# Patient Record
Sex: Female | Born: 1950 | Race: White | Hispanic: No | Marital: Married | Smoking: Former smoker
Health system: Southern US, Community
[De-identification: ages and names within clinical notes are randomized; demographics above are authoritative.]

## PROBLEM LIST (undated history)

## (undated) ENCOUNTER — Emergency Department (HOSPITAL_COMMUNITY)

## (undated) DIAGNOSIS — K589 Irritable bowel syndrome without diarrhea: Secondary | ICD-10-CM

## (undated) DIAGNOSIS — F419 Anxiety disorder, unspecified: Secondary | ICD-10-CM

## (undated) DIAGNOSIS — I471 Supraventricular tachycardia, unspecified: Secondary | ICD-10-CM

## (undated) DIAGNOSIS — L309 Dermatitis, unspecified: Secondary | ICD-10-CM

## (undated) DIAGNOSIS — Z9889 Other specified postprocedural states: Secondary | ICD-10-CM

## (undated) DIAGNOSIS — R002 Palpitations: Secondary | ICD-10-CM

## (undated) DIAGNOSIS — R112 Nausea with vomiting, unspecified: Secondary | ICD-10-CM

## (undated) DIAGNOSIS — K219 Gastro-esophageal reflux disease without esophagitis: Secondary | ICD-10-CM

## (undated) DIAGNOSIS — R519 Headache, unspecified: Secondary | ICD-10-CM

## (undated) DIAGNOSIS — I341 Nonrheumatic mitral (valve) prolapse: Secondary | ICD-10-CM

## (undated) HISTORY — PX: OTHER SURGICAL HISTORY: SHX169

## (undated) HISTORY — PX: TOOTH EXTRACTION: SUR596

## (undated) HISTORY — PX: TUBAL LIGATION: SHX77

## (undated) HISTORY — DX: Gastro-esophageal reflux disease without esophagitis: K21.9

## (undated) HISTORY — PX: COLONOSCOPY: SHX174

## (undated) HISTORY — PX: CHOLECYSTECTOMY: SHX55

## (undated) HISTORY — DX: Dermatitis, unspecified: L30.9

## (undated) HISTORY — PX: BREAST CYST ASPIRATION: SHX578

---

## 1998-03-17 ENCOUNTER — Ambulatory Visit (HOSPITAL_COMMUNITY): Admission: RE | Admit: 1998-03-17 | Discharge: 1998-03-17 | Payer: Self-pay | Admitting: Family Medicine

## 2001-12-06 ENCOUNTER — Ambulatory Visit (HOSPITAL_BASED_OUTPATIENT_CLINIC_OR_DEPARTMENT_OTHER): Admission: RE | Admit: 2001-12-06 | Discharge: 2001-12-06 | Payer: Self-pay | Admitting: Orthopedic Surgery

## 2002-10-28 ENCOUNTER — Encounter: Payer: Self-pay | Admitting: Emergency Medicine

## 2002-10-28 ENCOUNTER — Emergency Department (HOSPITAL_COMMUNITY): Admission: EM | Admit: 2002-10-28 | Discharge: 2002-10-29 | Payer: Self-pay | Admitting: Emergency Medicine

## 2002-11-02 ENCOUNTER — Emergency Department (HOSPITAL_COMMUNITY): Admission: EM | Admit: 2002-11-02 | Discharge: 2002-11-02 | Payer: Self-pay | Admitting: Emergency Medicine

## 2002-11-07 ENCOUNTER — Ambulatory Visit (HOSPITAL_COMMUNITY): Admission: RE | Admit: 2002-11-07 | Discharge: 2002-11-07 | Payer: Self-pay | Admitting: Gastroenterology

## 2002-11-07 ENCOUNTER — Encounter (INDEPENDENT_AMBULATORY_CARE_PROVIDER_SITE_OTHER): Payer: Self-pay | Admitting: Specialist

## 2004-10-10 ENCOUNTER — Other Ambulatory Visit: Admission: RE | Admit: 2004-10-10 | Discharge: 2004-10-10 | Payer: Self-pay | Admitting: Family Medicine

## 2005-06-28 ENCOUNTER — Encounter: Payer: Self-pay | Admitting: Family Medicine

## 2005-11-03 ENCOUNTER — Other Ambulatory Visit: Admission: RE | Admit: 2005-11-03 | Discharge: 2005-11-03 | Payer: Self-pay | Admitting: Family Medicine

## 2007-11-14 ENCOUNTER — Other Ambulatory Visit: Admission: RE | Admit: 2007-11-14 | Discharge: 2007-11-14 | Payer: Self-pay | Admitting: Family Medicine

## 2009-06-24 ENCOUNTER — Ambulatory Visit (HOSPITAL_BASED_OUTPATIENT_CLINIC_OR_DEPARTMENT_OTHER): Admission: RE | Admit: 2009-06-24 | Discharge: 2009-06-24 | Payer: Self-pay | Admitting: Orthopedic Surgery

## 2010-02-27 DIAGNOSIS — G459 Transient cerebral ischemic attack, unspecified: Secondary | ICD-10-CM

## 2010-02-27 HISTORY — DX: Transient cerebral ischemic attack, unspecified: G45.9

## 2010-05-17 LAB — POCT HEMOGLOBIN-HEMACUE: Hemoglobin: 12 g/dL (ref 12.0–15.0)

## 2010-07-15 NOTE — Op Note (Signed)
   NAME:  Pamela Lang, Pamela Lang                            ACCOUNT NO.:  [id number]   MEDICAL RECORD NO.:  [id number]                   PATIENT TYPE:  AMB   LOCATION:  ENDO                                 FACILITY:  [facility]   PHYSICIAN:  John C. Madilyn Fireman, M.D.                 DATE OF BIRTH:  [birth year]   DATE OF PROCEDURE:  11/07/2002  DATE OF DISCHARGE:                                 OPERATIVE REPORT   INDICATIONS FOR PROCEDURE:  Colon cancer screening in a 60 year old patient  with no prior screening.   DESCRIPTION OF PROCEDURE:  The patient was placed in the left lateral  decubitus position and placed on the pulse monitor with continuous low flow  oxygen delivered via nasal cannula.  She was sedated with 100 mcg of IV  fentanyl and 10 mg of IV Versed.  The Olympus video colonoscope was inserted  into the rectum and advanced to the cecum, confirmed by the  transillumination of McBurney's point and visualization of the ileocecal  valve and the appendiceal orifice.  The prep was excellent.  In the base of  the cecum, there was a 6 mm sessile polyp removed via hot biopsy.  The  remainder of the cecum, ascending, transverse, descending and sigmoid colon  appeared normal with no further polyps, masses, diverticula or other mucosal  abnormalities.  In the rectum, there was a 1.2 cm pedunculated polyp removed  by snare and it was sent in the same container with the other polyp.  No  other abnormalities in the rectum were noted.  The scope was then withdrawn  and the patient returned to the recovery room in stable condition.  She  tolerated the procedure well and there were no immediate postoperative  complications.   IMPRESSION:  Cecal and rectal polyps.   PLAN:  Await histology to determine method and interval for future colon  screening.                                                John C. Madilyn Fireman, M.D.    JCH/MEDQ  D:  11/07/2002  T:  11/08/2002  Job:  [id number]   cc:   Christella Noa, M.D.  [address] [place]  [place], [place] [address]  Fax: [phone]

## 2010-07-15 NOTE — Op Note (Signed)
NAME:  Pamela Lang, Pamela Lang                            ACCOUNT NO.:  [id number]   MEDICAL RECORD NO.:  [id number]                   PATIENT TYPE:  AMB   LOCATION:  DSC                                  FACILITY:  [facility]   PHYSICIAN:  Loreta Ave, M.D.              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   OPERATIVE PROCEDURE:  Left elbow examination under anesthesia, arthroscopy,  synovectomy, chondroplasty, removal of loose bodies, and extensive removal  of posterior spurs causing posterior impingement.   SURGEON:  Loreta Ave, M.D.   ASSISTANT:  Arlys John D. Petrarca, P.A.-C.   ANESTHESIA:  General.   BLOOD LOSS:  Minimal.   TOURNIQUET TIME:  One hour.   SPECIMENS:  None.   CULTURES:  None.   COMPLICATIONS:  None.   DRESSINGS:  Soft compressive.   PROCEDURE:  The patient was brought to the operating room, and after  adequate anesthesia had been obtained, the elbow was examined.  Motion from  30 to 120 degrees.  Relatively abrupt end points.  Full rotation, pronation,  and supination.  Stable ligaments.  Tourniquet applied.  Prepped and draped  in the usual sterile fashion in a supine position with the fishing pole  attachment for distraction.  Four arthroscopic portals utilized, one in each  anterolateral, anteromedial, one posterolateral, and one straight  posteriorly.  Utilizing anterior portals, the front of the elbow was entered  with ____, distended, and inspected.  Abundant synovitis throughout  debrided.  Grade 3 changes, radiocapitellar joint debrided.  Grade 4 changes  over most of the trochlear ulnar joint.  Loose fragments removed.  Spurs on  the humerus blocking flexion debrided, allowing me to achieve just about  full flexion.  All loose fragments removed.  The area of the posterior  aspect of the elbow with the posterior portals.  Extensive spurring and  posterior bony impingement.  Olecranon and humeral spurs all removed.  The  humeral fossa was opened up generously with a shaver and bur.  All spurs on  the back of the olecranon also removed with a bur to improve motion as much  as possible. At completion,  I could bend it within ten degrees of full  extension, and there was no bony impingement.  The entirety of the elbow  examined arthroscopically to be sure all loose fragments and loose bodies  had been removed.  Instruments fully removed.  Portals injected with  Marcaine without epinephrine and closed with nylon.  A sterile compressive  dressing was applied.  The tourniquet which had been inflated at the start  of the  case after exsanguination and 250 mmHg was deflated.  Anesthesia  reversed.  Brought to the recovery room.  Tolerated surgery well without  complications.                                               Loreta Ave, M.D.    DFM/MEDQ  D:  12/06/2001  T:  12/07/2001  Job:  [id number]

## 2011-03-28 ENCOUNTER — Other Ambulatory Visit: Payer: Self-pay | Admitting: Family Medicine

## 2011-03-28 DIAGNOSIS — Q279 Congenital malformation of peripheral vascular system, unspecified: Secondary | ICD-10-CM

## 2011-03-30 ENCOUNTER — Ambulatory Visit
Admission: RE | Admit: 2011-03-30 | Discharge: 2011-03-30 | Disposition: A | Discharge status: Home / Self Care | Payer: BC Managed Care – PPO | Source: Ambulatory Visit | Attending: Family Medicine | Admitting: Family Medicine

## 2011-03-30 DIAGNOSIS — Q279 Congenital malformation of peripheral vascular system, unspecified: Secondary | ICD-10-CM

## 2013-01-20 ENCOUNTER — Telehealth: Payer: Self-pay | Admitting: *Deleted

## 2013-01-20 ENCOUNTER — Encounter: Payer: Self-pay | Admitting: Podiatry

## 2013-01-20 ENCOUNTER — Ambulatory Visit (INDEPENDENT_AMBULATORY_CARE_PROVIDER_SITE_OTHER): Payer: BC Managed Care – PPO | Admitting: Podiatry

## 2013-01-20 VITALS — BP 102/63 | HR 93 | Resp 16 | Ht 63.0 in | Wt 152.0 lb

## 2013-01-20 DIAGNOSIS — B351 Tinea unguium: Secondary | ICD-10-CM

## 2013-01-20 NOTE — Telephone Encounter (Signed)
Toenail fragments sent for histopathology- PAS,GMS, FM and culture to [facility] [phone].

## 2013-01-20 NOTE — Progress Notes (Signed)
  Subjective:    Patient ID: Pamela Lang, female    DOB: [birth year], 62 y.o.   MRN: [id number] "These big toenails, the left one is about to come off."  HPI Comments: N  Loose, discolored L  Fungal Nails Hallux B/L   D  August O  Gradually C  Gotten worse, left > right A  Putting my sock on  T  none   Patient relates [place] the beach this [place] and subsequently noting the nails becoming discolored and the left hallux nail loosening over time.    Review of Systems  HENT: Positive for ear pain and tinnitus.   Cardiovascular: Positive for palpitations.  Gastrointestinal: Positive for constipation.  Allergic/Immunologic: Positive for environmental allergies.  Neurological: Positive for headaches.  All other systems reviewed and are negative.       Objective:   Physical Exam  Subjective: Orientated x59 62 year old white female presents with her husband.  Vascular: The DP and PT pulses are two over four bilaterally.  Dermatological: The left hallux nail plate is partially detached from the nailbed with texture and color changes, yellow-brown. The right hallux nail bed has some dark discolored areas within it with some slight color changes noted.  Neurological: Sensation intact bilaterally  Musculoskeletal: No deformities noted        Assessment & Plan:   Assessment: Probable traumatic nail changes left hallux and right hallux nail. Rule out onychomycoses hallux nails bilaterally.  Plan: The left hallux nail is debrided back and submitted for a PAS stain and fungal culture. I will notify patient on receipt of lab. We also discussed proper shoe size in attempt to avoid future trauma to the hallux nails.

## 2013-01-20 NOTE — Patient Instructions (Signed)
The office will contact him up on receipt of the lab. Please remember that 30 days is a minimal interval.

## 2013-03-05 ENCOUNTER — Other Ambulatory Visit: Payer: Self-pay | Admitting: Podiatry

## 2013-03-05 ENCOUNTER — Other Ambulatory Visit (INDEPENDENT_AMBULATORY_CARE_PROVIDER_SITE_OTHER): Payer: BC Managed Care – PPO | Admitting: Podiatry

## 2013-03-05 NOTE — Progress Notes (Signed)
Patient ID: Pamela Lang, female   DOB: Dec 18, 1950, 63 y.o.   MRN: [id number] Patient ID: Pamela Lang, female DOB: [birth year], 63 y.o. MRN: [id number]  "These big toenails, the left one is about to come off."  HPI Comments: N Loose, discolored  L Fungal Nails Hallux B/L  D August  O Gradually  C Gotten worse, left > right  A Putting my sock on  T none  Patient relates walking on the beach this summer and subsequently noting the nails becoming discolored and the left hallux nail loosening over time.  Review of Systems  HENT: Positive for ear pain and tinnitus.  Cardiovascular: Positive for palpitations.  Gastrointestinal: Positive for constipation.  Allergic/Immunologic: Positive for environmental allergies.  Neurological: Positive for headaches.  All other systems reviewed and are negative.  Objective:   Physical Exam  Subjective: Orientated x63 year old white female presents with her husband.  Vascular: The DP and PT pulses are two over four bilaterally.  Dermatological: The left hallux nail plate is partially detached from the nailbed with texture and color changes, yellow-brown. The right hallux nail bed has some dark discolored areas within it with some slight color changes noted.  Neurological: Sensation intact bilaterally  Musculoskeletal: No deformities noted  Assessment & Plan:   Assessment:FINAL Probable traumatic nail changes left hallux and right hallux nail.  Rule out onychomycoses hallux nails bilaterally.  Plan: The left hallux nail is debrided back and submitted for a PAS stain and fungal culture. I will notify   Results of lab from [facility] pathology service   [place] date 01/20/2013 received date 01/27/2013 Accession # [id number]  A PAS stain reaction and GMS stain failed to demonstrate fungal elements. A Fontana/Mason stain fails to demonstrate melanin pigment  Fungal culture final report 02/21/2013 No fungus isolated 28 days  Advised patient that lab was  negative for fungal infection in the nail and most likely her symptoms are related to trauma. No active treatment is needed however, the patient wanted to apply 40% urea cream to soften the nail that could be an option.

## 2013-03-13 ENCOUNTER — Telehealth: Payer: Self-pay | Admitting: *Deleted

## 2013-03-13 NOTE — Telephone Encounter (Signed)
Per Dr. Amalia Hailey, I called and informed patient that her fungal culture was negative, no follow up is needed.  Please call if she has any further questions or concerns.

## 2013-04-09 ENCOUNTER — Encounter: Payer: Self-pay | Admitting: Podiatry

## 2013-08-11 ENCOUNTER — Other Ambulatory Visit (HOSPITAL_COMMUNITY): Payer: Self-pay | Admitting: Family Medicine

## 2013-08-11 DIAGNOSIS — Z1231 Encounter for screening mammogram for malignant neoplasm of breast: Secondary | ICD-10-CM

## 2013-08-19 ENCOUNTER — Ambulatory Visit (HOSPITAL_COMMUNITY): Payer: BC Managed Care – PPO

## 2013-10-30 ENCOUNTER — Other Ambulatory Visit (HOSPITAL_COMMUNITY): Payer: Self-pay | Admitting: Family Medicine

## 2013-10-30 DIAGNOSIS — Z139 Encounter for screening, unspecified: Secondary | ICD-10-CM

## 2013-11-10 ENCOUNTER — Ambulatory Visit (HOSPITAL_COMMUNITY): Payer: BC Managed Care – PPO

## 2014-02-27 HISTORY — PX: CATARACT EXTRACTION: SUR2

## 2014-04-23 ENCOUNTER — Ambulatory Visit (INDEPENDENT_AMBULATORY_CARE_PROVIDER_SITE_OTHER): Payer: BC Managed Care – PPO | Admitting: Otolaryngology

## 2016-11-14 ENCOUNTER — Ambulatory Visit: Payer: BC Managed Care – PPO | Admitting: Interventional Cardiology

## 2017-10-30 ENCOUNTER — Other Ambulatory Visit: Payer: Self-pay | Admitting: Family Medicine

## 2017-10-30 DIAGNOSIS — Z1231 Encounter for screening mammogram for malignant neoplasm of breast: Secondary | ICD-10-CM

## 2018-01-02 ENCOUNTER — Encounter (INDEPENDENT_AMBULATORY_CARE_PROVIDER_SITE_OTHER): Payer: Self-pay

## 2018-01-02 ENCOUNTER — Ambulatory Visit
Admission: RE | Admit: 2018-01-02 | Discharge: 2018-01-02 | Disposition: A | Discharge status: Home / Self Care | Payer: Medicare Other | Source: Ambulatory Visit | Attending: Family Medicine | Admitting: Family Medicine

## 2018-01-02 DIAGNOSIS — Z1231 Encounter for screening mammogram for malignant neoplasm of breast: Secondary | ICD-10-CM

## 2018-12-24 DIAGNOSIS — Z8679 Personal history of other diseases of the circulatory system: Secondary | ICD-10-CM | POA: Insufficient documentation

## 2018-12-24 DIAGNOSIS — R002 Palpitations: Secondary | ICD-10-CM | POA: Insufficient documentation

## 2018-12-24 DIAGNOSIS — Z8673 Personal history of transient ischemic attack (TIA), and cerebral infarction without residual deficits: Secondary | ICD-10-CM | POA: Insufficient documentation

## 2019-01-14 NOTE — Progress Notes (Signed)
[facility] Note  01/15/2019     CHIEF COMPLAINT Patient presents for Retina Evaluation   HISTORY OF PRESENT ILLNESS: Pamela Lang is a 68 y.o. female who presents to the clinic today for:   HPI    Retina Evaluation    In left eye.  Onset: 3-6 months.  Duration: 3-6 months.  Associated Symptoms Negative for Flashes, Distortion, Pain, Photophobia, Trauma, Jaw Claudication, Fever, Fatigue, Floaters, Blind Spot, Redness, Glare, Scalp Tenderness, Shoulder/Hip pain and Weight Loss.  Context:  reading, watching TV, near vision and mid-range vision.  Treatments tried include no treatments.  I, the attending physician,  performed the HPI with the patient and updated documentation appropriately.          Comments    Patient states blurry vision OS for the last 3-6 months. Difficulty with reading and watching TV. If patient closes OD, has visual distortion OS. Being evaluated for heart palpitations 11.25.20. History of TIA in the past, associated with antibiotic containing corticosteroid (? Ciprodex). No eye gtts currently.       Last edited by Bernarda Caffey, MD on 01/15/2019 11:47 AM. (History)    pt saw Dr. Parke Simmers yesterday bc her left eye vision is blurry, she states she thought her lens  had moved, pt states she called her regular optometrist, Dr. Radford Pax, but she couldn't get in with him until February, she states she wanted to be seen sooner so she called Dr. Payton Emerald office and they saw her right away, pt states Dr. Parke Simmers told her that she has a macular hole as well as a freckle in the back of her eye, pt has heart palpitations that she is seeing a dr for, she states she is not on any medication for it yet, pt states Dr. Herbert Deaner did her cataract sx in 2016  Referring physician: [place], [place], [place], [place] [address]    HISTORICAL INFORMATION:   Selected notes from the [facility] Patient referred by Dr. Parke Simmers  to evaluate full thickness macular hole OS     CURRENT MEDICATIONS: Current Outpatient Medications (Ophthalmic Drugs)  Medication Sig  . loteprednol (LOTEMAX) 0.5 % ophthalmic suspension Place 1 drop into the left eye 2 (two) times daily.   No current facility-administered medications for this visit.  (Ophthalmic Drugs)   Current Outpatient Medications (Other)  Medication Sig  . lansoprazole (PREVACID) 15 MG capsule Take 15 mg by mouth as needed.  . ciprofloxacin-dexamethasone (CIPRODEX) otic suspension 4 drops 2 (two) times daily.  . SUMAtriptan (IMITREX) 50 MG tablet Take 50 mg by mouth every 2 (two) hours as needed for migraine or headache. May repeat in 2 hours if headache persists or recurs.   No current facility-administered medications for this visit.  (Other)      REVIEW OF SYSTEMS: ROS    Positive for: Cardiovascular, Eyes   Negative for: Constitutional, Gastrointestinal, Neurological, Skin, Genitourinary, Musculoskeletal, HENT, Endocrine, Respiratory, Psychiatric, Allergic/Imm, Heme/Lymph   Last edited by Roselee Nova D, COT on 01/15/2019  8:34 AM. (History)       ALLERGIES Allergies  Allergen Reactions  . Ceclor [Cefaclor] Nausea And Vomiting and Other (See Comments)    Severe stomach cramps  . Cortisone Other (See Comments)    TIA  . Propranolol Other (See Comments)    Decreased heart rate  . Aspirin Other (See Comments)    Irritates ulcers   . Augmentin [Amoxicillin-Pot Clavulanate] Nausea And Vomiting  .  Codeine Nausea And Vomiting  . Darvocet [Propoxyphene N-Acetaminophen] Nausea And Vomiting  . Doxycycline Nausea And Vomiting  . Sulfa Antibiotics Other (See Comments)    Migraine headache    PAST MEDICAL HISTORY Past Medical History:  Diagnosis Date  . Eczema    ear  . GERD (gastroesophageal reflux disease)   . TIA (transient ischemic attack) 2012   TIA   Past Surgical History:  Procedure Laterality Date  . BREAST CYST ASPIRATION Right    . CATARACT EXTRACTION Bilateral 2016   Dr. Herbert Deaner  . CESAREAN SECTION    . Cyst B/L Wrist Bilateral   . TOOTH EXTRACTION Bilateral   . TUBAL LIGATION      FAMILY HISTORY Family History  Problem Relation Age of Onset  . COPD Father   . Heart disease Father   . Diabetes Sister   . Diabetes Sister     SOCIAL HISTORY Social History   Tobacco Use  . Smoking status: Former Research scientist (life sciences)  . Smokeless tobacco: Former Network engineer Use Topics  . Alcohol use: No  . Drug use: No         OPHTHALMIC EXAM:  Base Eye Exam    Visual Acuity (Snellen - Linear)      Right Left   Dist [place] 20/20 -1 20/80 -2   Dist ph [place]  20/70 -3       Tonometry (Tonopen, 8:44 AM)      Right Left   Pressure 14 12       Pupils      Dark Light Shape React APD   Right 4 3 Round Brisk None   Left 4 3 Round Brisk None       Visual Fields (Counting fingers)      Left Right    Full Full       Extraocular Movement      Right Left    Full, Ortho Full, Ortho       Neuro/Psych    Oriented x3: Yes   Mood/Affect: Normal       Dilation    Both eyes: 1.0% Mydriacyl, 2.5% Phenylephrine @ 8:44 AM        Slit Lamp and Fundus Exam    Slit Lamp Exam      Right Left   Lids/Lashes Mild Meibomian gland dysfunction Mild Meibomian gland dysfunction   Conjunctiva/Sclera White and quiet White and quiet   Cornea Mild Arcus Mild Arcus, 1+ inferior Punctate epithelial erosions   Anterior Chamber Deep and quiet Deep and quiet   Iris Round and dilated Round and dilated   Lens PC IOL in good position PC IOL in good position, 1+ IT Posterior capsular opacification   Vitreous Vitreous syneresis Mild Vitreous syneresis       Fundus Exam      Right Left   Disc Pink and Sharp Pink and Sharp   C/D Ratio 0.5 0.5   Macula Flat, Blunted foveal reflex, trace Epiretinal membrane Flat, +Epiretinal membrane and small FTMH   Vessels Mild Vascular attenuation, mild Tortuousity, mild AV crossing changes Vascular  attenuation, mild Tortuousity   Periphery Attached, no heme    Attached, pigmented choroidal lesion at 0300 with +drusen, no SRF or ornage pigment        Refraction    Manifest Refraction      Sphere Cylinder Axis Dist VA   Right -0.50 +0.50 160 20/20   Left -0.50 +0.50 085 20/60-3  IMAGING AND PROCEDURES  Imaging and Procedures for _0 @  OCT, Retina - OU - Both Eyes       Right Eye Quality was good. Central Foveal Thickness: 257. Progression has no prior data. Findings include normal foveal contour, no IRF, no SRF, vitreomacular adhesion , epiretinal membrane.   Left Eye Quality was good. Central Foveal Thickness: 328. Progression has no prior data. Findings include abnormal foveal contour, epiretinal membrane, intraretinal fluid, no SRF, macular hole (Small FTMH with mild surrounding IRF and +ERM).   Notes *Images captured and stored on drive  Diagnosis / Impression:  OD: NFP, no IRF/SRF OS: Small FTMH with mild surrounding IRF and +ERM  Clinical management:  See below  Abbreviations: NFP - Normal foveal profile. CME - cystoid macular edema. PED - pigment epithelial detachment. IRF - intraretinal fluid. SRF - subretinal fluid. EZ - ellipsoid zone. ERM - epiretinal membrane. ORA - outer retinal atrophy. ORT - outer retinal tubulation. SRHM - subretinal hyper-reflective material        Yag Capsulotomy - OS - Left Eye       Procedure note: YAG Capsulotomy, LEFT Eye  Informed consent obtained. Pre-op dilating drops (1% Topicamide and 2.5% Phenylephrine), and topical anesthesia given. Power: 7.0 mJ Shots: 7 Posterior capsulotomy in cruciate formation performed without difficulty. Patient tolerated procedure well. No complications. Rx Lotemax SM, 2 times a day for 7 days, then stop. Pt received written and verbal post laser education.                  ASSESSMENT/PLAN:    ICD-10-CM   1. Macular hole of left eye  H35.342   2. Epiretinal  membrane (ERM) of left eye  H35.372   3. Retinal edema  H35.81 OCT, Retina - OU - Both Eyes  4. 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 Pseudophakia of both eyes  Z96.1   6. Left posterior capsular opacification  H26.492 Yag Capsulotomy - OS - Left Eye    1-3.  Macular Hole w/ ERM OS - The natural history, anatomy, potential for loss of vision, and treatment options including vitrectomy techniques and the complications of endophthalmitis, retinal detachment, vitreous hemorrhage, cataract progression and permanent vision loss discussed with the patient. - small full thickness macular hol - +metamorphopsia, distortion centrally - recommend 25g PPV + ERM/ILM peel, C3F8 gas OS under general anesthesia - RBA of procedure discussed, questions answered - informed consent obtained and signed - pt wishes to proceed with surgery on Thursday, Feb 06, 2019 - will need medical clearance, possible cardiology and anesthesia consults - will need pre-op COVID-19 testing - f/u POD1, 12.11.20  4. Choroidal Nevus OS  - relatively flat pigmented lesion  - located at 0300 periphery with +drusen, no SRF/orange pigment  - no suspicious features  - discussed findngs, prognosis  - monitor  - baseline Optos images at next visit  5. Pseudophakia OU  - s/p CE/IOL OU (Dr. Herbert Deaner, 2016)  - beautiful surgeries, doing well  - monitor  6. PCO OS  - mild PCO OS  - discussed findings and how it may be the etiology of her glare symptoms  - recommend YAG capsulotomy OS with [facility] to improve glare and to improve visualization for PPV above  - pt did not want to wait to go back to [facility] for YAG, and requested it be done today  - RBA of procedure discussed, questions answered  - informed consent obtained  and signed  - see procedure note  - start Lotemax SM BID OS x7 days   Ophthalmic Meds Ordered this visit:  Meds ordered this encounter  Medications  . loteprednol (LOTEMAX) 0.5 %  ophthalmic suspension    Sig: Place 1 drop into the left eye 2 (two) times daily.    Dispense:  5 mL    Refill:  0       Return in about 23 days (around 02/07/2019) for POV1 s/p PPV/MP/Gas OS for mac hole w/ ERM OS.  There are no Patient Instructions on file for this visit.   Explained the diagnoses, plan, and follow up with the patient and they expressed understanding.  Patient expressed understanding of the importance of proper follow up care.   This document serves as a record of services personally performed by Gardiner Sleeper, MD, PhD. It was created on their behalf by Roselee Nova, COMT. The creation of this record is the provider's dictation and/or activities during the visit.  Electronically signed by: Roselee Nova, COMT 01/15/19 12:09 PM   This document serves as a record of services personally performed by Gardiner Sleeper, MD, PhD. It was created on their behalf by Ernest Mallick, OA, an ophthalmic assistant. The creation of this record is the provider's dictation and/or activities during the visit.    Electronically signed by: Ernest Mallick, OA 11.18.2020 12:09 PM   Gardiner Sleeper, M.D., Ph.D. Diseases & Surgery of the Retina and Vitreous [facility]  I have reviewed the above documentation for accuracy and completeness, and I agree with the above. Gardiner Sleeper, M.D., Ph.D. 01/15/19 12:09 PM    Abbreviations: M myopia (nearsighted); A astigmatism; H hyperopia (farsighted); P presbyopia; Mrx spectacle prescription;  CTL contact lenses; OD right eye; OS left eye; OU both eyes  XT exotropia; ET esotropia; PEK punctate epithelial keratitis; PEE punctate epithelial erosions; DES dry eye syndrome; MGD meibomian gland dysfunction; ATs artificial tears; PFAT's preservative free artificial tears; [place] nuclear sclerotic cataract; PSC posterior subcapsular cataract; ERM epi-retinal membrane; PVD posterior vitreous detachment; RD retinal detachment; DM diabetes  mellitus; DR diabetic retinopathy; NPDR non-proliferative diabetic retinopathy; PDR proliferative diabetic retinopathy; CSME clinically significant macular edema; DME diabetic macular edema; dbh dot blot hemorrhages; CWS cotton wool spot; POAG primary open angle glaucoma; C/D cup-to-disc ratio; HVF humphrey visual field; GVF goldmann visual field; OCT optical coherence tomography; IOP intraocular pressure; BRVO Branch retinal vein occlusion; CRVO central retinal vein occlusion; CRAO central retinal artery occlusion; BRAO branch retinal artery occlusion; RT retinal tear; SB scleral buckle; PPV pars plana vitrectomy; VH Vitreous hemorrhage; PRP panretinal laser photocoagulation; IVK intravitreal kenalog; VMT vitreomacular traction; MH Macular hole;  NVD neovascularization of the disc; NVE neovascularization elsewhere; AREDS age related eye disease study; ARMD age related macular degeneration; POAG primary open angle glaucoma; EBMD epithelial/anterior basement membrane dystrophy; ACIOL anterior chamber intraocular lens; IOL intraocular lens; PCIOL posterior chamber intraocular lens; Phaco/IOL phacoemulsification with intraocular lens placement; [place] photorefractive keratectomy; LASIK laser assisted in situ keratomileusis; HTN hypertension; DM diabetes mellitus; COPD chronic obstructive pulmonary disease

## 2019-01-15 ENCOUNTER — Other Ambulatory Visit: Payer: Self-pay

## 2019-01-15 ENCOUNTER — Ambulatory Visit (INDEPENDENT_AMBULATORY_CARE_PROVIDER_SITE_OTHER): Payer: Medicare Other | Admitting: Ophthalmology

## 2019-01-15 ENCOUNTER — Encounter (INDEPENDENT_AMBULATORY_CARE_PROVIDER_SITE_OTHER): Payer: Self-pay | Admitting: Ophthalmology

## 2019-01-15 DIAGNOSIS — H3581 Retinal edema: Secondary | ICD-10-CM | POA: Diagnosis not present

## 2019-01-15 DIAGNOSIS — D3132 Benign neoplasm of left choroid: Secondary | ICD-10-CM | POA: Diagnosis not present

## 2019-01-15 DIAGNOSIS — H35342 Macular cyst, hole, or pseudohole, left eye: Secondary | ICD-10-CM | POA: Diagnosis not present

## 2019-01-15 DIAGNOSIS — H35372 Puckering of macula, left eye: Secondary | ICD-10-CM

## 2019-01-15 DIAGNOSIS — H26492 Other secondary cataract, left eye: Secondary | ICD-10-CM | POA: Diagnosis not present

## 2019-01-15 DIAGNOSIS — Z961 Presence of intraocular lens: Secondary | ICD-10-CM

## 2019-01-15 MED ORDER — LOTEPREDNOL ETABONATE 0.5 % OP SUSP: 1.0000 [drp] | Freq: Two times a day (BID) | OPHTHALMIC | 0 refills | Status: DC

## 2019-02-03 ENCOUNTER — Other Ambulatory Visit (HOSPITAL_COMMUNITY)
Admission: RE | Admit: 2019-02-03 | Discharge: 2019-02-03 | Disposition: A | Discharge status: Home / Self Care | Payer: Medicare Other | Source: Ambulatory Visit | Attending: Ophthalmology | Admitting: Ophthalmology

## 2019-02-03 DIAGNOSIS — Z20828 Contact with and (suspected) exposure to other viral communicable diseases: Secondary | ICD-10-CM | POA: Insufficient documentation

## 2019-02-03 DIAGNOSIS — Z01812 Encounter for preprocedural laboratory examination: Secondary | ICD-10-CM | POA: Insufficient documentation

## 2019-02-03 NOTE — H&P (Signed)
Pamela Lang is an 68 y.o. female.    Chief Complaint: decreased vision OS  HPI: Pt presented with a 3-6 mo history of decreased vision OS. On dilated exam and OCT, was found to have full thickness macular hole with epiretinal membrane OS. After discussion of findings, prognosis and treatment options, the patient elected to proceed with 25g pars plana vitrectomy with ERM/ILM peel + gas OS under general anesthesia.  Past Medical History:  Diagnosis Date  . Eczema    ear  . GERD (gastroesophageal reflux disease)   . TIA (transient ischemic attack) 2012   TIA    Past Surgical History:  Procedure Laterality Date  . BREAST CYST ASPIRATION Right   . CATARACT EXTRACTION Bilateral 2016   Dr. Herbert Deaner  . CESAREAN SECTION    . Cyst B/L Wrist Bilateral   . TOOTH EXTRACTION Bilateral   . TUBAL LIGATION      Family History  Problem Relation Age of Onset  . COPD Father   . Heart disease Father   . Diabetes Sister   . Diabetes Sister    Social History:  reports that she has quit smoking. She has quit using smokeless tobacco. She reports that she does not drink alcohol or use drugs.  Allergies:  Allergies  Allergen Reactions  . Ceclor [Cefaclor] Nausea And Vomiting and Other (See Comments)    Severe stomach cramps  . Cortisone Other (See Comments)    TIA  . Propranolol Other (See Comments)    Decreased heart rate  . Aspirin Other (See Comments)    Irritates ulcers   . Augmentin [Amoxicillin-Pot Clavulanate] Nausea And Vomiting  . Ciprodex [Ciprofloxacin-Dexamethasone]     Migraines  . Codeine Nausea And Vomiting  . Darvocet [Propoxyphene N-Acetaminophen] Nausea And Vomiting  . Diazepam     Tachycardia   . Doxycycline Nausea And Vomiting  . Omnaris [Ciclesonide]     Migraines   . Other     ALL STEROIDS CAUSE TACHYCARDIA   . Sulfa Antibiotics Other (See Comments)    Migraine headache  . Sumatriptan Other (See Comments)    Tachycardia   . Zofran [Ondansetron Hcl]     Severe  Migraines, Tachycardia   . Zoloft [Sertraline Hcl]     Tachycardia     No medications prior to admission.    Review of systems otherwise negative  There were no vitals taken for this visit.  Physical exam: Mental status: oriented x3. Eyes: See eye exam associated with this date of surgery Ears, Nose, Throat: within normal limits Neck: Within Normal limits General: within normal limits Chest: Within normal limits Breast: deferred Heart: Within normal limits Abdomen: Within normal limits GU: deferred Extremities: within normal limits Skin: within normal limits  Assessment/Plan 1. 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 Vitreoretinal Surgeon [facility]

## 2019-02-04 LAB — NOVEL CORONAVIRUS, NAA (HOSP ORDER, SEND-OUT TO REF LAB; TAT 18-24 HRS): SARS-CoV-2, NAA: NOT DETECTED

## 2019-02-05 ENCOUNTER — Encounter (HOSPITAL_COMMUNITY): Payer: Self-pay | Admitting: *Deleted

## 2019-02-05 ENCOUNTER — Other Ambulatory Visit: Payer: Self-pay

## 2019-02-05 NOTE — Progress Notes (Signed)
Patient denies shortness of breath, fever, cough and chest pain.  PCP - Delman Cheadle, PA Cardiologist - Dr Abran Richard  Chest x-ray - denies EKG - 12/24/18 CE  Stress Test - denies ECHO - 01/22/19 CE Cardiac Cath - denies  Anesthesia review: No  STOP now taking any Aspirin (unless otherwise instructed by your surgeon), Aleve, Naproxen, Ibuprofen, Motrin, Advil, Goody's, BC's, all herbal medications, fish oil, and all vitamins.   Coronavirus Screening Covid test on 02/03/19 was negative.  Patient verbalized understanding of instructions that were given via phone.

## 2019-02-06 ENCOUNTER — Other Ambulatory Visit: Payer: Self-pay

## 2019-02-06 ENCOUNTER — Ambulatory Visit (HOSPITAL_COMMUNITY): Payer: Medicare Other | Admitting: Anesthesiology

## 2019-02-06 ENCOUNTER — Encounter (HOSPITAL_COMMUNITY): Payer: Self-pay | Admitting: Ophthalmology

## 2019-02-06 ENCOUNTER — Encounter (HOSPITAL_COMMUNITY)
Admission: RE | Disposition: A | Discharge status: Home / Self Care | Payer: Self-pay | Source: Home / Self Care | Attending: Ophthalmology

## 2019-02-06 ENCOUNTER — Ambulatory Visit (HOSPITAL_COMMUNITY)
Admission: RE | Admit: 2019-02-06 | Discharge: 2019-02-06 | Disposition: A | Discharge status: Home / Self Care | Payer: Medicare Other | Attending: Ophthalmology | Admitting: Ophthalmology

## 2019-02-06 DIAGNOSIS — F419 Anxiety disorder, unspecified: Secondary | ICD-10-CM | POA: Diagnosis not present

## 2019-02-06 DIAGNOSIS — K219 Gastro-esophageal reflux disease without esophagitis: Secondary | ICD-10-CM | POA: Diagnosis not present

## 2019-02-06 DIAGNOSIS — Z8249 Family history of ischemic heart disease and other diseases of the circulatory system: Secondary | ICD-10-CM | POA: Insufficient documentation

## 2019-02-06 DIAGNOSIS — Z87891 Personal history of nicotine dependence: Secondary | ICD-10-CM | POA: Insufficient documentation

## 2019-02-06 DIAGNOSIS — I341 Nonrheumatic mitral (valve) prolapse: Secondary | ICD-10-CM | POA: Diagnosis not present

## 2019-02-06 DIAGNOSIS — Z8673 Personal history of transient ischemic attack (TIA), and cerebral infarction without residual deficits: Secondary | ICD-10-CM | POA: Insufficient documentation

## 2019-02-06 DIAGNOSIS — H35342 Macular cyst, hole, or pseudohole, left eye: Secondary | ICD-10-CM

## 2019-02-06 HISTORY — DX: Supraventricular tachycardia, unspecified: I47.10

## 2019-02-06 HISTORY — DX: Anxiety disorder, unspecified: F41.9

## 2019-02-06 HISTORY — DX: Nausea with vomiting, unspecified: Z98.890

## 2019-02-06 HISTORY — DX: Nonrheumatic mitral (valve) prolapse: I34.1

## 2019-02-06 HISTORY — PX: PHOTOCOAGULATION WITH LASER: SHX6027

## 2019-02-06 HISTORY — DX: Other specified postprocedural states: R11.2

## 2019-02-06 HISTORY — PX: 25 GAUGE PARS PLANA VITRECTOMY WITH 20 GAUGE MVR PORT FOR MACULAR HOLE: SHX6096

## 2019-02-06 HISTORY — PX: GAS/FLUID EXCHANGE: SHX5334

## 2019-02-06 HISTORY — DX: Headache, unspecified: R51.9

## 2019-02-06 HISTORY — DX: Irritable bowel syndrome, unspecified: K58.9

## 2019-02-06 HISTORY — DX: Palpitations: R00.2

## 2019-02-06 HISTORY — DX: Supraventricular tachycardia: I47.1

## 2019-02-06 LAB — CBC
HCT: 46.2 % — ABNORMAL HIGH (ref 36.0–46.0)
Hemoglobin: 15 g/dL (ref 12.0–15.0)
MCH: 31.3 pg (ref 26.0–34.0)
MCHC: 32.5 g/dL (ref 30.0–36.0)
MCV: 96.3 fL (ref 80.0–100.0)
Platelets: 208 10*3/uL (ref 150–400)
RBC: 4.8 MIL/uL (ref 3.87–5.11)
RDW: 12.2 % (ref 11.5–15.5)
WBC: 7 10*3/uL (ref 4.0–10.5)
nRBC: 0 % (ref 0.0–0.2)

## 2019-02-06 LAB — POCT I-STAT, CHEM 8
BUN: 15 mg/dL (ref 8–23)
Calcium, Ion: 1.11 mmol/L — ABNORMAL LOW (ref 1.15–1.40)
Chloride: 108 mmol/L (ref 98–111)
Creatinine, Ser: 0.7 mg/dL (ref 0.44–1.00)
Glucose, Bld: 95 mg/dL (ref 70–99)
HCT: 44 % (ref 36.0–46.0)
Hemoglobin: 15 g/dL (ref 12.0–15.0)
Potassium: 4.1 mmol/L (ref 3.5–5.1)
Sodium: 142 mmol/L (ref 135–145)
TCO2: 24 mmol/L (ref 22–32)

## 2019-02-06 LAB — BASIC METABOLIC PANEL
Anion gap: 10 (ref 5–15)
BUN: 15 mg/dL (ref 8–23)
CO2: 22 mmol/L (ref 22–32)
Calcium: 8.8 mg/dL — ABNORMAL LOW (ref 8.9–10.3)
Chloride: 109 mmol/L (ref 98–111)
Creatinine, Ser: 0.83 mg/dL (ref 0.44–1.00)
GFR calc Af Amer: >60 mL/min (ref >60)
GFR calc non Af Amer: >60 mL/min (ref >60)
Glucose, Bld: 96 mg/dL (ref 70–99)
Potassium: 4 mmol/L (ref 3.5–5.1)
Sodium: 141 mmol/L (ref 135–145)

## 2019-02-06 SURGERY — 25 GAUGE PARS PLANA VITRECTOMY WITH 20 GAUGE MVR PORT FOR MACULAR HOLE
Anesthesia: General | Site: Eye | Laterality: Left

## 2019-02-06 MED ORDER — ONDANSETRON HCL 4 MG/2ML IJ SOLN
INTRAMUSCULAR | Status: AC
  Filled 2019-02-06: qty 2

## 2019-02-06 MED ORDER — FENTANYL CITRATE (PF) 250 MCG/5ML IJ SOLN
INTRAMUSCULAR | Status: DC | PRN
  Administered 2019-02-06 (×4): 50 ug via INTRAVENOUS

## 2019-02-06 MED ORDER — ROCURONIUM BROMIDE 10 MG/ML (PF) SYRINGE
PREFILLED_SYRINGE | INTRAVENOUS | Status: DC | PRN
  Administered 2019-02-06: 15 mg via INTRAVENOUS
  Administered 2019-02-06: 25 mg via INTRAVENOUS
  Administered 2019-02-06: 60 mg via INTRAVENOUS

## 2019-02-06 MED ORDER — TRIAMCINOLONE ACETONIDE 40 MG/ML IJ SUSP
INTRAMUSCULAR | Status: AC
  Filled 2019-02-06: qty 5

## 2019-02-06 MED ORDER — PROPOFOL 10 MG/ML IV BOLUS
INTRAVENOUS | Status: AC
  Filled 2019-02-06: qty 20

## 2019-02-06 MED ORDER — LIDOCAINE HCL (PF) 4 % IJ SOLN
INTRAMUSCULAR | Status: AC
  Filled 2019-02-06: qty 5

## 2019-02-06 MED ORDER — EPINEPHRINE PF 1 MG/ML IJ SOLN
INTRAMUSCULAR | Status: AC
  Filled 2019-02-06: qty 1

## 2019-02-06 MED ORDER — DORZOLAMIDE HCL-TIMOLOL MAL 2-0.5 % OP SOLN
OPHTHALMIC | Status: DC | PRN
  Administered 2019-02-06: 1 [drp] via OPHTHALMIC

## 2019-02-06 MED ORDER — SUGAMMADEX SODIUM 200 MG/2ML IV SOLN
INTRAVENOUS | Status: DC | PRN
  Administered 2019-02-06: 200 mg via INTRAVENOUS

## 2019-02-06 MED ORDER — PREDNISOLONE ACETATE 1 % OP SUSP
OPHTHALMIC | Status: DC | PRN
  Administered 2019-02-06: 1 [drp] via OPHTHALMIC

## 2019-02-06 MED ORDER — PREDNISOLONE ACETATE 1 % OP SUSP
OPHTHALMIC | Status: AC
  Filled 2019-02-06: qty 5

## 2019-02-06 MED ORDER — GATIFLOXACIN 0.5 % OP SOLN OPTIME - NO CHARGE
OPHTHALMIC | Status: DC | PRN
  Administered 2019-02-06: 1 [drp] via OPHTHALMIC

## 2019-02-06 MED ORDER — INDOCYANINE GREEN 25 MG IV SOLR
INTRAVENOUS | Status: AC
  Filled 2019-02-06: qty 25

## 2019-02-06 MED ORDER — BRIMONIDINE TARTRATE 0.2 % OP SOLN
OPHTHALMIC | Status: AC
  Filled 2019-02-06: qty 5

## 2019-02-06 MED ORDER — PROPOFOL 500 MG/50ML IV EMUL
INTRAVENOUS | Status: DC | PRN
  Administered 2019-02-06: 125 ug/kg/min via INTRAVENOUS

## 2019-02-06 MED ORDER — SODIUM CHLORIDE (PF) 0.9 % IJ SOLN
INTRAMUSCULAR | Status: AC
  Filled 2019-02-06: qty 10

## 2019-02-06 MED ORDER — NA CHONDROIT SULF-NA HYALURON 40-30 MG/ML IO SOLN
INTRAOCULAR | Status: AC
  Filled 2019-02-06: qty 1

## 2019-02-06 MED ORDER — CARBACHOL 0.01 % IO SOLN
INTRAOCULAR | Status: AC
  Filled 2019-02-06: qty 1.5

## 2019-02-06 MED ORDER — PHENYLEPHRINE HCL 10 % OP SOLN
1.0000 [drp] | OPHTHALMIC | Status: AC | PRN
  Administered 2019-02-06 (×3): 1 [drp] via OPHTHALMIC
  Filled 2019-02-06: qty 5

## 2019-02-06 MED ORDER — BUPIVACAINE HCL (PF) 0.75 % IJ SOLN
INTRAMUSCULAR | Status: AC
  Filled 2019-02-06: qty 10

## 2019-02-06 MED ORDER — ACETAMINOPHEN 10 MG/ML IV SOLN
INTRAVENOUS | Status: AC
  Filled 2019-02-06: qty 100

## 2019-02-06 MED ORDER — BACITRACIN-POLYMYXIN B 500-10000 UNIT/GM OP OINT
TOPICAL_OINTMENT | OPHTHALMIC | Status: DC | PRN
  Administered 2019-02-06: 1 via OPHTHALMIC

## 2019-02-06 MED ORDER — BACITRACIN-POLYMYXIN B 500-10000 UNIT/GM OP OINT
TOPICAL_OINTMENT | OPHTHALMIC | Status: AC
  Filled 2019-02-06: qty 3.5

## 2019-02-06 MED ORDER — FENTANYL CITRATE (PF) 250 MCG/5ML IJ SOLN
INTRAMUSCULAR | Status: AC
  Filled 2019-02-06: qty 5

## 2019-02-06 MED ORDER — LIDOCAINE HCL (PF) 2 % IJ SOLN
INTRAMUSCULAR | Status: AC
  Filled 2019-02-06: qty 10

## 2019-02-06 MED ORDER — ATROPINE SULFATE 1 % OP SOLN
OPHTHALMIC | Status: AC
  Filled 2019-02-06: qty 5

## 2019-02-06 MED ORDER — FENTANYL CITRATE (PF) 100 MCG/2ML IJ SOLN
25.0000 ug | INTRAMUSCULAR | Status: DC | PRN
  Administered 2019-02-06 (×2): 50 ug via INTRAVENOUS

## 2019-02-06 MED ORDER — DORZOLAMIDE HCL-TIMOLOL MAL 2-0.5 % OP SOLN
OPHTHALMIC | Status: AC
  Filled 2019-02-06: qty 10

## 2019-02-06 MED ORDER — TOBRAMYCIN-DEXAMETHASONE 0.3-0.1 % OP OINT
TOPICAL_OINTMENT | OPHTHALMIC | Status: AC
  Filled 2019-02-06: qty 3.5

## 2019-02-06 MED ORDER — LACTATED RINGERS IV SOLN: INTRAVENOUS | Status: DC

## 2019-02-06 MED ORDER — BSS IO SOLN
INTRAOCULAR | Status: AC
  Filled 2019-02-06: qty 15

## 2019-02-06 MED ORDER — PROMETHAZINE HCL 25 MG/ML IJ SOLN: 6.2500 mg | INTRAMUSCULAR | Status: DC | PRN

## 2019-02-06 MED ORDER — PROPARACAINE HCL 0.5 % OP SOLN
1.0000 [drp] | OPHTHALMIC | Status: AC | PRN
  Administered 2019-02-06 (×3): 1 [drp] via OPHTHALMIC
  Filled 2019-02-06: qty 15

## 2019-02-06 MED ORDER — LIDOCAINE 2% (20 MG/ML) 5 ML SYRINGE
INTRAMUSCULAR | Status: AC
  Filled 2019-02-06: qty 15

## 2019-02-06 MED ORDER — BRIMONIDINE TARTRATE 0.2 % OP SOLN
OPHTHALMIC | Status: DC | PRN
  Administered 2019-02-06: 1 [drp] via OPHTHALMIC

## 2019-02-06 MED ORDER — ATROPINE SULFATE 1 % OP SOLN
OPHTHALMIC | Status: DC | PRN
  Administered 2019-02-06: 1 [drp] via OPHTHALMIC

## 2019-02-06 MED ORDER — PROMETHAZINE HCL 25 MG/ML IJ SOLN
INTRAMUSCULAR | Status: DC | PRN
  Administered 2019-02-06: 6.25 mg via INTRAVENOUS

## 2019-02-06 MED ORDER — MIDAZOLAM HCL 2 MG/2ML IJ SOLN
INTRAMUSCULAR | Status: AC
  Filled 2019-02-06: qty 2

## 2019-02-06 MED ORDER — TRIAMCINOLONE ACETONIDE 40 MG/ML IJ SUSP
INTRAMUSCULAR | Status: DC | PRN
  Administered 2019-02-06: 1 mL

## 2019-02-06 MED ORDER — BSS PLUS IO SOLN
INTRAOCULAR | Status: AC
  Filled 2019-02-06: qty 500

## 2019-02-06 MED ORDER — TROPICAMIDE 1 % OP SOLN
1.0000 [drp] | OPHTHALMIC | Status: AC | PRN
  Administered 2019-02-06 (×3): 1 [drp] via OPHTHALMIC
  Filled 2019-02-06: qty 15

## 2019-02-06 MED ORDER — EPINEPHRINE PF 1 MG/ML IJ SOLN
INTRAOCULAR | Status: DC | PRN
  Administered 2019-02-06: 500 mL

## 2019-02-06 MED ORDER — DEXTROSE 5 % IV SOLN
INTRAVENOUS | Status: AC
  Filled 2019-02-06: qty 100

## 2019-02-06 MED ORDER — ACETAMINOPHEN 10 MG/ML IV SOLN
1000.0000 mg | Freq: Once | INTRAVENOUS | Status: DC | PRN
  Administered 2019-02-06: 1000 mg via INTRAVENOUS

## 2019-02-06 MED ORDER — STERILE WATER FOR INJECTION IJ SOLN
INTRAMUSCULAR | Status: AC
  Filled 2019-02-06: qty 10

## 2019-02-06 MED ORDER — SCOPOLAMINE 1 MG/3DAYS TD PT72
MEDICATED_PATCH | TRANSDERMAL | Status: DC | PRN
  Administered 2019-02-06: 1 via TRANSDERMAL

## 2019-02-06 MED ORDER — NA CHONDROIT SULF-NA HYALURON 40-30 MG/ML IO SOLN
INTRAOCULAR | Status: DC | PRN
  Administered 2019-02-06: 0.5 mL via INTRAOCULAR

## 2019-02-06 MED ORDER — PHENYLEPHRINE HCL-NACL 10-0.9 MG/250ML-% IV SOLN
INTRAVENOUS | Status: DC | PRN
  Administered 2019-02-06: 25 ug/min via INTRAVENOUS

## 2019-02-06 MED ORDER — CEFTAZIDIME 1 G IJ SOLR
INTRAMUSCULAR | Status: AC
  Filled 2019-02-06: qty 1

## 2019-02-06 MED ORDER — PROPOFOL 10 MG/ML IV BOLUS
INTRAVENOUS | Status: DC | PRN
  Administered 2019-02-06: 110 mg via INTRAVENOUS
  Administered 2019-02-06: 20 mg via INTRAVENOUS

## 2019-02-06 MED ORDER — LACTATED RINGERS IV SOLN
INTRAVENOUS | Status: DC | PRN
  Administered 2019-02-06: 12:00:00 via INTRAVENOUS

## 2019-02-06 MED ORDER — PROPOFOL 1000 MG/100ML IV EMUL
INTRAVENOUS | Status: AC
  Filled 2019-02-06: qty 100

## 2019-02-06 MED ORDER — BRILLIANT BLUE G 0.025 % IO SOSY
0.5000 mL | PREFILLED_SYRINGE | INTRAOCULAR | Status: AC
  Administered 2019-02-06: 0.5 mL via INTRAOCULAR
  Filled 2019-02-06: qty 0.5

## 2019-02-06 MED ORDER — LIDOCAINE 2% (20 MG/ML) 5 ML SYRINGE
INTRAMUSCULAR | Status: DC | PRN
  Administered 2019-02-06: 40 mg via INTRAVENOUS

## 2019-02-06 MED ORDER — SODIUM CHLORIDE 0.9 % IV SOLN
INTRAVENOUS | Status: DC
  Administered 2019-02-06: 09:00:00 via INTRAVENOUS

## 2019-02-06 MED ORDER — ACETAMINOPHEN 160 MG/5ML PO SOLN: 325.0000 mg | Freq: Once | ORAL | Status: DC | PRN

## 2019-02-06 MED ORDER — ACETAMINOPHEN 325 MG PO TABS: 325.0000 mg | ORAL_TABLET | Freq: Once | ORAL | Status: DC | PRN

## 2019-02-06 MED ORDER — FENTANYL CITRATE (PF) 100 MCG/2ML IJ SOLN
INTRAMUSCULAR | Status: AC
  Filled 2019-02-06: qty 2

## 2019-02-06 MED ORDER — ACETAZOLAMIDE SODIUM 500 MG IJ SOLR
INTRAMUSCULAR | Status: AC
  Filled 2019-02-06: qty 500

## 2019-02-06 MED ORDER — MEPERIDINE HCL 25 MG/ML IJ SOLN: 6.2500 mg | INTRAMUSCULAR | Status: DC | PRN

## 2019-02-06 MED ORDER — POLYMYXIN B SULFATE 500000 UNITS IJ SOLR
INTRAMUSCULAR | Status: AC
  Filled 2019-02-06: qty 500000

## 2019-02-06 MED ORDER — STERILE WATER FOR INJECTION IJ SOLN
INTRAMUSCULAR | Status: DC | PRN
  Administered 2019-02-06: 500 mL

## 2019-02-06 MED ORDER — GATIFLOXACIN 0.5 % OP SOLN
OPHTHALMIC | Status: AC
  Filled 2019-02-06: qty 2.5

## 2019-02-06 MED ORDER — ATROPINE SULFATE 1 % OP SOLN
1.0000 [drp] | OPHTHALMIC | Status: AC | PRN
  Administered 2019-02-06 (×3): 1 [drp] via OPHTHALMIC
  Filled 2019-02-06: qty 5

## 2019-02-06 MED ORDER — STERILE WATER FOR INJECTION IJ SOLN
INTRAMUSCULAR | Status: DC | PRN
  Administered 2019-02-06: .5 mL

## 2019-02-06 MED ORDER — DEXAMETHASONE SODIUM PHOSPHATE 10 MG/ML IJ SOLN
INTRAMUSCULAR | Status: AC
  Filled 2019-02-06: qty 1

## 2019-02-06 SURGICAL SUPPLY — 81 items
APL SWBSTK 6 STRL LF DISP (MISCELLANEOUS) ×5
APPLICATOR COTTON TIP 6 STRL (MISCELLANEOUS) ×4 IMPLANT
APPLICATOR COTTON TIP 6IN STRL (MISCELLANEOUS) ×15
BAND WRIST GAS GREEN (MISCELLANEOUS) IMPLANT
BLADE EYE CATARACT 19 1.4 BEAV (BLADE) IMPLANT
BLADE MVR KNIFE 20G (BLADE) ×1 IMPLANT
BNDG EYE OVAL (GAUZE/BANDAGES/DRESSINGS) ×5 IMPLANT
CABLE BIPOLOR RESECTION CORD (MISCELLANEOUS) ×3 IMPLANT
CANNULA ANT CHAM MAIN (OPHTHALMIC RELATED) IMPLANT
CANNULA FLEX TIP 25G (CANNULA) ×3 IMPLANT
CANNULA TROCAR 23 GA VLV (OPHTHALMIC) IMPLANT
CANNULA TROCAR 23G VLV (OPHTHALMIC) IMPLANT
CANNULA VLV SOFT TIP 25G (OPHTHALMIC) IMPLANT
CANNULA VLV SOFT TIP 25GA (OPHTHALMIC) IMPLANT
CLOSURE STERI-STRIP 1/2X4 (GAUZE/BANDAGES/DRESSINGS) ×1
CLSR STERI-STRIP ANTIMIC 1/2X4 (GAUZE/BANDAGES/DRESSINGS) ×2 IMPLANT
COVER WAND RF STERILE (DRAPES) ×1 IMPLANT
DRAPE MICROSCOPE LEICA 46X105 (MISCELLANEOUS) ×3 IMPLANT
DRAPE OPHTHALMIC 77X100 STRL (CUSTOM PROCEDURE TRAY) ×3 IMPLANT
ERASER HMR WETFIELD 23G BP (MISCELLANEOUS) IMPLANT
FILTER BLUE MILLIPORE (MISCELLANEOUS) IMPLANT
FILTER STRAW FLUID ASPIR (MISCELLANEOUS) IMPLANT
FORCEPS GRIESHABER ILM 25G A (INSTRUMENTS) IMPLANT
GAS AUTO FILL CONSTEL (OPHTHALMIC) ×3
GAS AUTO FILL CONSTELLATION (OPHTHALMIC) IMPLANT
GAS WRIST BAND GREEN (MISCELLANEOUS) ×2
GLOVE BIO SURGEON STRL SZ7.5 (GLOVE) ×6 IMPLANT
GLOVE BIOGEL M 7.0 STRL (GLOVE) ×5 IMPLANT
GOWN STRL REUS W/ TWL LRG LVL3 (GOWN DISPOSABLE) ×2 IMPLANT
GOWN STRL REUS W/ TWL XL LVL3 (GOWN DISPOSABLE) ×1 IMPLANT
GOWN STRL REUS W/TWL LRG LVL3 (GOWN DISPOSABLE) ×6
GOWN STRL REUS W/TWL XL LVL3 (GOWN DISPOSABLE) ×3
KIT BASIN OR (CUSTOM PROCEDURE TRAY) ×3 IMPLANT
KIT PERFLUORON PROCEDURE 5ML (MISCELLANEOUS) IMPLANT
LENS BIOM SUPER VIEW SET DISP (MISCELLANEOUS) IMPLANT
LENS MACULAR ASPHERIC CONSTEL (OPHTHALMIC) ×2 IMPLANT
LENS VITRECTOMY FLAT OCLR DISP (MISCELLANEOUS) IMPLANT
LOOP FINESSE 25 GA (MISCELLANEOUS) ×2 IMPLANT
MICROPICK 25G (MISCELLANEOUS)
NDL 18GX1X1/2 (RX/OR ONLY) (NEEDLE) ×1 IMPLANT
NDL 25GX 5/8IN NON SAFETY (NEEDLE) ×5 IMPLANT
NDL HYPO 30X.5 LL (NEEDLE) ×2 IMPLANT
NDL PRECISIONGLIDE 27X1.5 (NEEDLE) IMPLANT
NEEDLE 18GX1X1/2 (RX/OR ONLY) (NEEDLE) ×9 IMPLANT
NEEDLE 25GX 5/8IN NON SAFETY (NEEDLE) ×9 IMPLANT
NEEDLE HYPO 30X.5 LL (NEEDLE) IMPLANT
NEEDLE PRECISIONGLIDE 27X1.5 (NEEDLE) IMPLANT
NS IRRIG 1000ML POUR BTL (IV SOLUTION) ×1 IMPLANT
PACK VITRECTOMY CUSTOM (CUSTOM PROCEDURE TRAY) ×3 IMPLANT
PAD ARMBOARD 7.5X6 YLW CONV (MISCELLANEOUS) ×4 IMPLANT
PAK PIK VITRECTOMY CVS 25GA (OPHTHALMIC) ×3 IMPLANT
PENCIL BIPOLAR 25GA STR DISP (OPHTHALMIC RELATED) ×1 IMPLANT
PICK MICROPICK 25G (MISCELLANEOUS) IMPLANT
PROBE DIATHERMY DSP 27GA (MISCELLANEOUS) IMPLANT
PROBE ENDO DIATHERMY 25G (MISCELLANEOUS) ×3 IMPLANT
PROBE LASER ILLUM FLEX CVD 25G (OPHTHALMIC) ×2 IMPLANT
REPL STRA BRUSH NDL (NEEDLE) IMPLANT
REPL STRA BRUSH NEEDLE (NEEDLE) IMPLANT
RESERVOIR BACK FLUSH (MISCELLANEOUS) IMPLANT
RETRACTOR IRIS FLEX 25G GRIESH (INSTRUMENTS) IMPLANT
SPEAR EYE SURG WECK-CEL (MISCELLANEOUS) ×4 IMPLANT
SPONGE SURGIFOAM ABS GEL 12-7 (HEMOSTASIS) IMPLANT
STOPCOCK 4 WAY LG BORE MALE ST (IV SETS) IMPLANT
SUT CHROMIC 7 0 TG140 8 (SUTURE) ×3 IMPLANT
SUT ETHILON 5.0 S-24 (SUTURE) ×1 IMPLANT
SUT ETHILON 9 0 TG140 8 (SUTURE) ×1 IMPLANT
SUT MERSILENE 4 0 RV 2 (SUTURE) ×1 IMPLANT
SUT SILK 2 0 (SUTURE)
SUT SILK 2 0 TIES 17X18 (SUTURE)
SUT SILK 2-0 18XBRD TIE 12 (SUTURE) ×1 IMPLANT
SUT SILK 2-0 18XBRD TIE BLK (SUTURE) ×1 IMPLANT
SUT SILK 4 0 RB 1 (SUTURE) ×1 IMPLANT
SUT VICRYL 7 0 TG140 8 (SUTURE) ×1 IMPLANT
SYR 10ML LL (SYRINGE) ×5 IMPLANT
SYR 20ML LL LF (SYRINGE) ×1 IMPLANT
SYR 5ML LL (SYRINGE) ×1 IMPLANT
SYR BULB 3OZ (MISCELLANEOUS) ×1 IMPLANT
SYR TB 1ML LUER SLIP (SYRINGE) ×8 IMPLANT
TOWEL GREEN STERILE FF (TOWEL DISPOSABLE) ×1 IMPLANT
TUBING HIGH PRESS EXTEN 6IN (TUBING) ×1 IMPLANT
WATER STERILE IRR 1000ML POUR (IV SOLUTION) ×3 IMPLANT

## 2019-02-06 NOTE — Anesthesia Preprocedure Evaluation (Addendum)
Anesthesia Evaluation  Patient identified by MRN, date of birth, ID band Patient awake    Reviewed: Allergy & Precautions, NPO status , Patient's Chart, lab work & pertinent test results  History of Anesthesia Complications (+) PONV  Airway Mallampati: II  TM Distance: >3 FB Neck ROM: Full    Dental  (+) Dental Advisory Given, Partial Lower, Partial Upper   Pulmonary former smoker,    breath sounds clear to auscultation       Cardiovascular + Valvular Problems/Murmurs MVP  Rhythm:Regular Rate:Normal     Neuro/Psych  Headaches, Anxiety TIA   GI/Hepatic Neg liver ROS, GERD  Medicated,  Endo/Other  negative endocrine ROS  Renal/GU negative Renal ROS     Musculoskeletal negative musculoskeletal ROS (+)   Abdominal Normal abdominal exam  (+)   Peds  Hematology negative hematology ROS (+)   Anesthesia Other Findings   Reproductive/Obstetrics                            Anesthesia Physical Anesthesia Plan  ASA: II  Anesthesia Plan: General   Post-op Pain Management:    Induction: Intravenous  PONV Risk Score and Plan: 4 or greater and Treatment may vary due to age or medical condition, TIVA and Propofol infusion  Airway Management Planned: Oral ETT  Additional Equipment: None  Intra-op Plan:   Post-operative Plan: Extubation in OR  Informed Consent: I have reviewed the patients History and Physical, chart, labs and discussed the procedure including the risks, benefits and alternatives for the proposed anesthesia with the patient or authorized representative who has indicated his/her understanding and acceptance.     Dental advisory given  Plan Discussed with: CRNA  Anesthesia Plan Comments:       Anesthesia Quick Evaluation

## 2019-02-06 NOTE — Op Note (Signed)
Date of procedure:  11/07/2018   Surgeon: Bernarda Caffey, MD, PhD   Assistant: Ernest Mallick, Ophthalmic Assistant   Pre-operative Diagnoses:  Full thickness macular hole, Left Eye Epiretinal membrane, Left Eye   Post-operative diagnosis:  Full thickness macular hole, Left Eye Epiretinal membrane, Left Eye   Anesthesia: General   Procedure:   1. 25 gauge pars plana vitrectomy, Left Eye 2. TissueBlue stain, Left Eye 3. Internal Limiting Membrane and Epiretinal membrane peel, Left Eye  4. Endolaser, Left Eye 5. 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  After discussing the risks, benefits, and alternatives to surgery, the patient electively decided to undergo surgical repair and informed consent was obtained.  The surgery was an attempt to remove the epiretinal membrane and ILM and close the macular hole and potentially improve the vision within the reasonable expectations of the surgeon.    Procedure in Detail:    The patient was met in the pre-operative holding area where their identification data was verified.  It was noted that there was a signed, informed consent in the chart and the Left Eye eye was verbally verified by the patient as the operative eye and was marked with a marking pen. The patient was then taken to the operating room and placed in the supine position. General endotracheal anesthesia was induced.   The eye was then prepped with 5% betadine and draped in the normal fashion for ophthalmic surgery. The microscope was draped and swung into position, and a secondary time-out was performed to identify the correct patient, eyes, procedures, and any allergies.   A 25 gauge trocar was inserted in a 30-45 degrees fashion into the inferotemporal quadrant 3.5 mm posterior to the limbus in this pseudophakic patient.  Correct positioning within the vitreous was verified externally with the light pipe.  The infusion was then connected to the cannula and BSS infusion was commenced.  Additional ports were placed in the superonasal and superotemporal quadrants. Viscoat was placed on the cornea. Direct vitrectomy was performed to remove the anterior hyaloid and some posterior capsular remnants. The BIOM was then used to visualize the posterior segment while the core vitrectomy was completed.  The patient had a visible full thickness macular hole and a posterior vitreous detachment was confirmed using suction over the optic nerve head and lifting anteriorly. The remaining vitreous was removed. Kenalog was used to aid in this process. A thorough peripheral vitrectomy was performed.      Pre-packaged TissueBlue was then used to stain the internal limiting membrane. Of note, there was minimal staining on first attempt due to an extensive epiretinal membrane. TissueBlue was applied multiple times throughout the peel to visualize the ILM. A macular contact lens was placed on the eye. End-grasping ILM forceps and a Finesse loop were both used to create an opening in the ILM. The ILM and overlying ERM was peeled fully from the macula taking care to avoid traction on the macular hole.   The wide angle viewing system was brought back into position. Scleral depression was performed to inspect the peripheral retina. On 360 inspection, there were no retinal tears or defects. Scleral depression was then used to apply 360 degrees of prophylactic endolaser just posterior to the ora. An air fluid exchange was performed.    The superotemporal port was then removed and sutured with 7-0  vicryl, there was no leakage. 14% C3F8 gas was connected to the infusion line and gas was injected into the posterior segment while venting air through the superonasal trocar using the extrusion cannula. Once a full, 40cc of gas was vented through the eye, the  infusion port and venting ports were removed and they were sutured with 7-0 vicryl.  There was no leakage from the sclerotomy sites.   Subconjunctival injections of kefzol + bacitracin + polymixin b and kenalog were then administered, and antibiotic and steroid drops as well as antibiotic ointment were placed in the eye.  The drapes were removed and the eye was patched and shielded.  A green gas bracelet was placed on the patient's left wrist. The patient was then taken to the post-operative area for recovery having tolerated the procedure well.  She was instructed to perform face down positioning postoperatively and to follow up in clinic the following morning as scheduled.   Estimate blood lost: none Complications: None

## 2019-02-06 NOTE — Transfer of Care (Signed)
Immediate Anesthesia Transfer of Care Note  Patient: Pamela Lang  Procedure(s) Performed: 25 GAUGE PARS PLANA VITRECTOMY WITH 20 GAUGE MVR PORT FOR MACULAR HOLE (Left Eye) Photocoagulation With Laser (Left Eye) Gas/Fluid Exchange (Left Eye)  Patient Location: PACU  Anesthesia Type:General  Level of Consciousness: awake, alert  and oriented  Airway & Oxygen Therapy: Patient Spontanous Breathing and Patient connected to face mask oxygen  Post-op Assessment: Report given to RN and Post -op Vital signs reviewed and stable  Post vital signs: Reviewed and stable  Last Vitals:  Vitals Value Taken Time  BP 107/69 02/06/19 1426  Temp    Pulse 66 02/06/19 1429  Resp 12 02/06/19 1429  SpO2 100 % 02/06/19 1429  Vitals shown include unvalidated device data.  Last Pain:  Vitals:   02/06/19 0917  PainSc: 0-No pain      Patients Stated Pain Goal: 3 ([id number])  Complications: No apparent anesthesia complications

## 2019-02-06 NOTE — Anesthesia Postprocedure Evaluation (Signed)
Anesthesia Post Note  Patient: Pamela Lang  Procedure(s) Performed: 25 GAUGE PARS PLANA VITRECTOMY WITH 20 GAUGE MVR PORT FOR MACULAR HOLE (Left Eye) Photocoagulation With Laser (Left Eye) Gas/Fluid Exchange (Left Eye)     Patient location during evaluation: PACU Anesthesia Type: General Level of consciousness: awake and alert Pain management: pain level controlled Vital Signs Assessment: post-procedure vital signs reviewed and stable Respiratory status: spontaneous breathing, nonlabored ventilation, respiratory function stable and patient connected to nasal cannula oxygen Cardiovascular status: blood pressure returned to baseline and stable Postop Assessment: no apparent nausea or vomiting Anesthetic complications: no    Last Vitals:  Vitals:   02/06/19 1511 02/06/19 1525  BP: 96/68 105/70  Pulse: (!) 57 60  Resp: 11 16  Temp:    SpO2: 100% 100%    Last Pain:  Vitals:   02/06/19 1525  PainSc: 2                  Matthieu Loftus P Christen Bedoya

## 2019-02-06 NOTE — Anesthesia Procedure Notes (Signed)
Procedure Name: Intubation Date/Time: 02/06/2019 11:55 AM Performed by: Mariea Clonts, CRNA Pre-anesthesia Checklist: Patient identified, Emergency Drugs available, Suction available and Patient being monitored Patient Re-evaluated:Patient Re-evaluated prior to induction Oxygen Delivery Method: Circle System Utilized Preoxygenation: Pre-oxygenation with 100% oxygen Induction Type: IV induction and Cricoid Pressure applied Ventilation: Mask ventilation without difficulty Laryngoscope Size: Miller and 2 Grade View: Grade II Tube type: Oral Tube size: 7.0 mm Number of attempts: 1 Airway Equipment and Method: Stylet and Oral airway Placement Confirmation: ETT inserted through vocal cords under direct vision,  positive ETCO2 and breath sounds checked- equal and bilateral Tube secured with: Tape Dental Injury: Teeth and Oropharynx as per pre-operative assessment

## 2019-02-06 NOTE — Brief Op Note (Signed)
02/06/2019  2:23 PM  PATIENT:  Courtney Paris Wirz  68 y.o. female  PRE-OPERATIVE DIAGNOSIS:  macular hole, left eye  POST-OPERATIVE DIAGNOSIS:  macular hole, left eye  PROCEDURE:  Procedure(s) with comments: 25 GAUGE PARS PLANA VITRECTOMY WITH 20 GAUGE MVR PORT FOR MACULAR HOLE (Left) Photocoagulation With Laser (Left) Gas/Fluid Exchange (Left) - C3F8  SURGEON:  Surgeon(s) and Role:    Bernarda Caffey, MD - Primary   ASSISTANTS: Ernest Mallick, Ophthalmic Assistant  ANESTHESIA:   general  EBL:  5 mL   BLOOD ADMINISTERED:none  DRAINS: none   LOCAL MEDICATIONS USED:  NONE  SPECIMEN:  No Specimen  DISPOSITION OF SPECIMEN:  N/A  COUNTS:  YES  TOURNIQUET:  * No tourniquets in log *  DICTATION: .Note written in EPIC  PLAN OF CARE: Discharge to home after PACU  PATIENT DISPOSITION:  PACU - hemodynamically stable.   Delay start of Pharmacological VTE agent (>24hrs) due to surgical blood loss or risk of bleeding: not applicable

## 2019-02-06 NOTE — Progress Notes (Signed)
[facility] Note  02/07/2019     CHIEF COMPLAINT Patient presents for Post-op Follow-up   HISTORY OF PRESENT ILLNESS: Pamela Lang is a 68 y.o. female who presents to the clinic today for:   HPI    Post-op Follow-up    In left eye.  Vision is stable.  I, the attending physician,  performed the HPI with the patient and updated documentation appropriately.          Comments    Pt states she has no eye pain today OS.  Patient states OS was a little achy yesterday.  Patient complains of persistent productive cough since yesterday after surgery.       Last edited by Bernarda Caffey, MD on 02/07/2019  8:50 AM. (History)    pt states last night went well, she states she is not in any pain this morning and she was able to sleep face down, pt states she saw a line in her vision when she woke up this morning, pt states she has had   Referring physician: [place], [place], [place], [place] [address]    HISTORICAL INFORMATION:   Selected notes from the [facility] Patient referred by Dr. Parke Simmers to evaluate full thickness macular hole OS     CURRENT MEDICATIONS: No current outpatient medications on file. (Ophthalmic Drugs)   No current facility-administered medications for this visit. (Ophthalmic Drugs)   Current Outpatient Medications (Other)  Medication Sig  . ALPRAZolam (XANAX) 0.25 MG tablet Take 0.25-0.5 mg by mouth every 6 (six) hours as needed for anxiety.  . famotidine (PEPCID) 20 MG tablet Take 20 mg by mouth as needed for heartburn or indigestion.   No current facility-administered medications for this visit. (Other)      REVIEW OF SYSTEMS: ROS    Positive for: Cardiovascular, Eyes   Negative for: Constitutional, Gastrointestinal, Neurological, Skin, Genitourinary, Musculoskeletal, HENT, Endocrine, Respiratory, Psychiatric, Allergic/Imm, Heme/Lymph   Last edited by Doneen Poisson on 02/07/2019   8:12 AM. (History)       ALLERGIES Allergies  Allergen Reactions  . Ceclor [Cefaclor] Nausea And Vomiting and Other (See Comments)    Severe stomach cramps  . Cortisone Other (See Comments)    TIA  . Propranolol Other (See Comments)    Decreased heart rate  . Aspirin Other (See Comments)    Irritates ulcers   . Augmentin [Amoxicillin-Pot Clavulanate] Nausea And Vomiting  . Ciprodex [Ciprofloxacin-Dexamethasone]     Migraines  . Codeine Nausea And Vomiting  . Darvocet [Propoxyphene N-Acetaminophen] Nausea And Vomiting  . Diazepam     Tachycardia   . Doxycycline Nausea And Vomiting  . Omnaris [Ciclesonide]     Migraines   . Other     ALL STEROIDS CAUSE TACHYCARDIA   . Sulfa Antibiotics Other (See Comments)    Migraine headache  . Sumatriptan Other (See Comments)    Tachycardia   . Zofran [Ondansetron Hcl]     Severe Migraines, Tachycardia   . Zoloft [Sertraline Hcl]     Tachycardia     PAST MEDICAL HISTORY Past Medical History:  Diagnosis Date  . Anxiety   . Eczema    ear  . GERD (gastroesophageal reflux disease)    diet controlled  . Headache    occasional - last one in 2019  . IBS (irritable bowel syndrome)   . MVP (mitral valve prolapse)    no problems  . Palpitations  EKG 12/24/18 and ECHO 01/22/19 normal in [facility]  - rx Diltiazem cd 120 mg qd but has not started until after 02/06/19 procedure with  Coralyn Pear  . PONV (postoperative nausea and vomiting)   . SVT (supraventricular tachycardia) ([place])   . TIA (transient ischemic attack) 2012   TIA   Past Surgical History:  Procedure Laterality Date  . [place] VITRECTOMY WITH 20 GAUGE MVR PORT FOR MACULAR HOLE Left 02/06/2019   Procedure: 25 GAUGE PARS PLANA VITRECTOMY WITH 20 GAUGE MVR PORT FOR MACULAR HOLE;  Surgeon: Bernarda Caffey, MD;  Location: [facility];  Service: Ophthalmology;  Laterality: Left;  . BREAST CYST ASPIRATION Right   . CATARACT EXTRACTION Bilateral 2016   Dr. Herbert Deaner  . CESAREAN  SECTION     x 2  . COLONOSCOPY    . Cyst B/L Wrist Bilateral   . GAS/FLUID EXCHANGE Left 02/06/2019   Procedure: Gas/Fluid Exchange;  Surgeon: Bernarda Caffey, MD;  Location: [facility];  Service: Ophthalmology;  Laterality: Left;  C3F8  . PHOTOCOAGULATION WITH LASER Left 02/06/2019   Procedure: Photocoagulation With Laser;  Surgeon: Bernarda Caffey, MD;  Location: [facility];  Service: Ophthalmology;  Laterality: Left;  . TOOTH EXTRACTION Bilateral    wisdom toothe ext  . TUBAL LIGATION      FAMILY HISTORY Family History  Problem Relation Age of Onset  . COPD Father   . Heart disease Father   . Diabetes Sister   . Diabetes Sister     SOCIAL HISTORY Social History   Tobacco Use  . Smoking status: Former Smoker    Types: Cigarettes  . Smokeless tobacco: Former Systems developer  . Tobacco comment: quit 30 yrs ago per patient 02/06/19  Substance Use Topics  . Alcohol use: No  . Drug use: No         OPHTHALMIC EXAM:  Base Eye Exam    Visual Acuity (Snellen - Linear)      Right Left   Dist [place] 20/20 -1 CF @ face   Dist ph   NI       Tonometry (Tonopen, 8:13 AM)      Right Left   Pressure  13       Pupils      Dark   Right    Left Dilated       Extraocular Movement      Right Left    Full Full       Neuro/Psych    Oriented x3: Yes   Mood/Affect: Normal       Dilation    Left eye: 1.0% Mydriacyl, 2.5% Phenylephrine @ 8:13 AM        Slit Lamp and Fundus Exam    Slit Lamp Exam      Right Left   Lids/Lashes Mild Meibomian gland dysfunction Mild Meibomian gland dysfunction   Conjunctiva/Sclera White and quiet Subconjunctival hemorrhage, sutures intact   Cornea Mild Arcus Mild Arcus, 1+ inferior Punctate epithelial erosions, trace Descemet's folds   Anterior Chamber Deep and quiet Deep, 2+ Cell   Iris Round and dilated Round and moderately dilated   Lens PC IOL in good position PC IOL in good position, 1+ IT Posterior capsular opacification   Vitreous Vitreous syneresis  post vitrectomy, good gas fill       Fundus Exam      Right Left   Disc  mild pre-retinal heme overlying disc   C/D Ratio 0.5 0.5   Macula  Flat, attached, mac  hole closing   Vessels  Vascular attenuation, mild Tortuousity   Periphery  Attached, pigmented choroidal lesion at 0300 with +drusen, no SRF or ornage pigment, hemorrhagic choroidal inferiorly          IMAGING AND PROCEDURES  Imaging and Procedures for _0 @           ASSESSMENT/PLAN:    ICD-10-CM   1. Macular hole of left eye  H35.342   2. Epiretinal membrane (ERM) of left eye  H35.372   3. Retinal edema  H35.81   4. 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 Pseudophakia of both eyes  Z96.1   6. Left posterior capsular opacification  H26.492     1-3.  Macular Hole w/ ERM OS  - small full thickness macular hole  - +metamorphopsia, distortion centrally  - now POD1 s/p PPV/TissueBlue stain/MP/14% C3F8 OS, 12.10.20             - doing well this morning             - retina attached and good gas bubble in place w/ mac hole closing  - mild preretinal heme overlying disc  - ?hemorrhagic choroidal inferiorly             - IOP okay at 13  - good gas fill             - start PF 6x/day OS                          zymaxid QID OS                          Atropine QD OS    Brimonidine QD OS                         PSO ung QID OS              - cont face down positioning x3 days then can decrease positioning to 50% of time; avoid laying flat on back              - eye shield when sleeping              - post op drop and positioning instructions reviewed              - tylenol/ibuprofen for pain  - f/u Tuesday, December 15  4. Choroidal Nevus OS  - relatively flat pigmented lesion  - located at 0300 periphery with +drusen, no SRF/orange pigment  - no suspicious features  - discussed findngs, prognosis  - monitor  - baseline Optos images at next visit  5. Pseudophakia OU  - s/p CE/IOL OU (Dr. Herbert Deaner,  2016)  - beautiful surgeries, doing well  - monitor  6. PCO OS  - mild PCO OS  - discussed findings and how it may be the etiology of her glare symptoms  - s/p YAG capsulotomy OS (11.18.20)  - completed Lotemax SM BID OS x7 days   Ophthalmic Meds Ordered this visit:  No orders of the defined types were placed in this encounter.      Return in about 4 days (around 02/11/2019) for f/u mac hole OS, DFE -- overbook okay, 8AM.  There are no Patient Instructions on file for this visit.   Explained the diagnoses, plan, and follow up with the patient  and they expressed understanding.  Patient expressed understanding of the importance of proper follow up care.   This document serves as a record of services personally performed by Gardiner Sleeper, MD, PhD. It was created on their behalf by Roselee Nova, COMT. The creation of this record is the provider's dictation and/or activities during the visit.  This document serves as a record of services personally performed by Gardiner Sleeper, MD, PhD. It was created on their behalf by Ernest Mallick, OA, an ophthalmic assistant. The creation of this record is the provider's dictation and/or activities during the visit.    Electronically signed by: Ernest Mallick, OA 12.10.2020 8:57 AM   Gardiner Sleeper, M.D., Ph.D. Diseases & Surgery of the Retina and Vitreous [facility]  I have reviewed the above documentation for accuracy and completeness, and I agree with the above. Gardiner Sleeper, M.D., Ph.D. 02/07/19 8:57 AM   Abbreviations: M myopia (nearsighted); A astigmatism; H hyperopia (farsighted); P presbyopia; Mrx spectacle prescription;  CTL contact lenses; OD right eye; OS left eye; OU both eyes  XT exotropia; ET esotropia; PEK punctate epithelial keratitis; PEE punctate epithelial erosions; DES dry eye syndrome; MGD meibomian gland dysfunction; ATs artificial tears; PFAT's preservative free artificial tears; [place] nuclear  sclerotic cataract; PSC posterior subcapsular cataract; ERM epi-retinal membrane; PVD posterior vitreous detachment; RD retinal detachment; DM diabetes mellitus; DR diabetic retinopathy; NPDR non-proliferative diabetic retinopathy; PDR proliferative diabetic retinopathy; CSME clinically significant macular edema; DME diabetic macular edema; dbh dot blot hemorrhages; CWS cotton wool spot; POAG primary open angle glaucoma; C/D cup-to-disc ratio; HVF humphrey visual field; GVF goldmann visual field; OCT optical coherence tomography; IOP intraocular pressure; BRVO Branch retinal vein occlusion; CRVO central retinal vein occlusion; CRAO central retinal artery occlusion; BRAO branch retinal artery occlusion; RT retinal tear; SB scleral buckle; PPV pars plana vitrectomy; VH Vitreous hemorrhage; PRP panretinal laser photocoagulation; IVK intravitreal kenalog; VMT vitreomacular traction; MH Macular hole;  NVD neovascularization of the disc; NVE neovascularization elsewhere; AREDS age related eye disease study; ARMD age related macular degeneration; POAG primary open angle glaucoma; EBMD epithelial/anterior basement membrane dystrophy; ACIOL anterior chamber intraocular lens; IOL intraocular lens; PCIOL posterior chamber intraocular lens; Phaco/IOL phacoemulsification with intraocular lens placement; [place] photorefractive keratectomy; LASIK laser assisted in situ keratomileusis; HTN hypertension; DM diabetes mellitus; COPD chronic obstructive pulmonary disease

## 2019-02-06 NOTE — Discharge Instructions (Addendum)
POSTOPERATIVE INSTRUCTIONS  Your doctor has performed vitreoretinal surgery on you at [facility]. [place] eye patched and shielded until seen by Dr. Coralyn Pear 830 AM tomorrow in clinic - Do not use drops until return - [place] - Sleep with belly down or on right side, avoid laying flat on back.    - No strenuous bending, stooping or lifting.  - You may not drive until further notice.  - If your doctor used a gas bubble in your eye during the procedure he will advise you on postoperative positioning. If you have a gas bubble you will be wearing a green bracelet that was applied in the operating room. The green bracelet should stay on as long as the gas bubble is in your eye. While the gas bubble is present you should not fly in an airplane. If you require general anesthesia while the gas bubble is present you must notify your anesthesiologist that an intraocular gas bubble is present so he can take the appropriate precautions.  - Tylenol or any other over-the-counter pain reliever can be used according to your doctor. If more pain medicine is required, your doctor will have a prescription for you.  - You may read, go up and down stairs, and watch television.    Bernarda Caffey, M.D., Ph.D.

## 2019-02-06 NOTE — Interval H&P Note (Signed)
History and Physical Interval Note:  02/06/2019 11:27 AM  Pamela Lang  has presented today for surgery, with the diagnosis of macular hole, left eye.  The various methods of treatment have been discussed with the patient and family. After consideration of risks, benefits and other options for treatment, the patient has consented to  Procedure(s): 25 GAUGE PARS PLANA VITRECTOMY WITH 20 GAUGE MVR PORT FOR MACULAR HOLE (Left) as a surgical intervention.  The patient's history has been reviewed, patient examined, no change in status, stable for surgery.  I have reviewed the patient's chart and labs.  Questions were answered to the patient's satisfaction.     Bernarda Caffey

## 2019-02-07 ENCOUNTER — Telehealth (INDEPENDENT_AMBULATORY_CARE_PROVIDER_SITE_OTHER): Payer: Self-pay

## 2019-02-07 ENCOUNTER — Ambulatory Visit (INDEPENDENT_AMBULATORY_CARE_PROVIDER_SITE_OTHER): Payer: Medicare Other | Admitting: Ophthalmology

## 2019-02-07 DIAGNOSIS — Z961 Presence of intraocular lens: Secondary | ICD-10-CM

## 2019-02-07 DIAGNOSIS — D3132 Benign neoplasm of left choroid: Secondary | ICD-10-CM

## 2019-02-07 DIAGNOSIS — H35372 Puckering of macula, left eye: Secondary | ICD-10-CM

## 2019-02-07 DIAGNOSIS — H35342 Macular cyst, hole, or pseudohole, left eye: Secondary | ICD-10-CM

## 2019-02-07 DIAGNOSIS — H3581 Retinal edema: Secondary | ICD-10-CM

## 2019-02-07 DIAGNOSIS — H26492 Other secondary cataract, left eye: Secondary | ICD-10-CM

## 2019-02-11 ENCOUNTER — Other Ambulatory Visit: Payer: Self-pay

## 2019-02-11 ENCOUNTER — Ambulatory Visit (INDEPENDENT_AMBULATORY_CARE_PROVIDER_SITE_OTHER): Payer: Medicare Other | Admitting: Ophthalmology

## 2019-02-11 ENCOUNTER — Encounter (INDEPENDENT_AMBULATORY_CARE_PROVIDER_SITE_OTHER): Payer: Self-pay | Admitting: Ophthalmology

## 2019-02-11 DIAGNOSIS — H35342 Macular cyst, hole, or pseudohole, left eye: Secondary | ICD-10-CM

## 2019-02-11 DIAGNOSIS — Z961 Presence of intraocular lens: Secondary | ICD-10-CM

## 2019-02-11 DIAGNOSIS — H3581 Retinal edema: Secondary | ICD-10-CM

## 2019-02-11 DIAGNOSIS — D3132 Benign neoplasm of left choroid: Secondary | ICD-10-CM

## 2019-02-11 DIAGNOSIS — H35372 Puckering of macula, left eye: Secondary | ICD-10-CM

## 2019-02-11 MED ORDER — PREDNISOLONE ACETATE 1 % OP SUSP: 1.0000 [drp] | Freq: Every day | OPHTHALMIC | 1 refills | Status: DC

## 2019-02-11 NOTE — Progress Notes (Addendum)
[facility] Note  02/11/2019     CHIEF COMPLAINT Patient presents for Post-op Follow-up   HISTORY OF PRESENT ILLNESS: Pamela Lang is a 68 y.o. female who presents to the clinic today for:   HPI    Post-op Follow-up    In left eye.  Discomfort includes Negative for pain, itching, foreign body sensation, tearing, discharge, floaters and none.  Vision is blurred at distance and is blurred at near.  I, the attending physician,  performed the HPI with the patient and updated documentation appropriately.          Comments    Patient states vision still blurry OS. No eye pain. Using gtts/ointment as instructed       Last edited by Bernarda Caffey, MD on 02/13/2019  8:41 AM. (History)    pt states she is very uncomfortable keeping her face down, but she is maintaining face down at least 50 mins an hr, pt states one of the drops she is on is triggering her SVG  Referring physician: [place], [place], [place], [place] [address]    HISTORICAL INFORMATION:   Selected notes from the [facility] Patient referred by Dr. Parke Simmers to evaluate full thickness macular hole OS     CURRENT MEDICATIONS: Current Outpatient Medications (Ophthalmic Drugs)  Medication Sig  . atropine 1 % ophthalmic solution Place 1 drop into the left eye daily.  . bacitracin-polymyxin b (POLYSPORIN) ophthalmic ointment Place 1 application into the left eye 4 (four) times daily. apply to eye every 12 hours while awake  . brimonidine (ALPHAGAN) 0.2 % ophthalmic solution Place 1 drop into the left eye daily.  Marland Kitchen gatifloxacin (ZYMAXID) 0.5 % SOLN Place 1 drop into the left eye 4 (four) times daily.  . prednisoLONE acetate (PRED FORTE) 1 % ophthalmic suspension Place 1 drop into the left eye 6 (six) times daily.   No current facility-administered medications for this visit. (Ophthalmic Drugs)   Current Outpatient Medications (Other)  Medication Sig  .  ALPRAZolam (XANAX) 0.25 MG tablet Take 0.25-0.5 mg by mouth every 6 (six) hours as needed for anxiety.  . famotidine (PEPCID) 20 MG tablet Take 20 mg by mouth as needed for heartburn or indigestion.   No current facility-administered medications for this visit. (Other)      REVIEW OF SYSTEMS: ROS    Positive for: Cardiovascular, Eyes   Negative for: Constitutional, Gastrointestinal, Neurological, Skin, Genitourinary, Musculoskeletal, HENT, Endocrine, Respiratory, Psychiatric, Allergic/Imm, Heme/Lymph   Last edited by Roselee Nova D, COT on 02/11/2019  7:57 AM. (History)       ALLERGIES Allergies  Allergen Reactions  . Ceclor [Cefaclor] Nausea And Vomiting and Other (See Comments)    Severe stomach cramps  . Cortisone Other (See Comments)    TIA  . Propranolol Other (See Comments)    Decreased heart rate  . Aspirin Other (See Comments)    Irritates ulcers   . Augmentin [Amoxicillin-Pot Clavulanate] Nausea And Vomiting  . Ciprodex [Ciprofloxacin-Dexamethasone]     Migraines  . Codeine Nausea And Vomiting  . Darvocet [Propoxyphene N-Acetaminophen] Nausea And Vomiting  . Diazepam     Tachycardia   . Doxycycline Nausea And Vomiting  . Omnaris [Ciclesonide]     Migraines   . Other     ALL STEROIDS CAUSE TACHYCARDIA   . Sulfa Antibiotics Other (See Comments)    Migraine headache  . Sumatriptan Other (See Comments)    Tachycardia   .  Zofran [Ondansetron Hcl]     Severe Migraines, Tachycardia   . Zoloft [Sertraline Hcl]     Tachycardia     PAST MEDICAL HISTORY Past Medical History:  Diagnosis Date  . Anxiety   . Eczema    ear  . GERD (gastroesophageal reflux disease)    diet controlled  . Headache    occasional - last one in 2019  . IBS (irritable bowel syndrome)   . MVP (mitral valve prolapse)    no problems  . Palpitations    EKG 12/24/18 and ECHO 01/22/19 normal in [facility]  - rx Diltiazem cd 120 mg qd but has not started until after 02/06/19 procedure with   Coralyn Pear  . PONV (postoperative nausea and vomiting)   . SVT (supraventricular tachycardia) ([place])   . TIA (transient ischemic attack) 2012   TIA   Past Surgical History:  Procedure Laterality Date  . [place] VITRECTOMY WITH 20 GAUGE MVR PORT FOR MACULAR HOLE Left 02/06/2019   Procedure: 25 GAUGE PARS PLANA VITRECTOMY WITH 20 GAUGE MVR PORT FOR MACULAR HOLE;  Surgeon: Bernarda Caffey, MD;  Location: [facility];  Service: Ophthalmology;  Laterality: Left;  . BREAST CYST ASPIRATION Right   . CATARACT EXTRACTION Bilateral 2016   Dr. Herbert Deaner  . CESAREAN SECTION     x 2  . COLONOSCOPY    . Cyst B/L Wrist Bilateral   . GAS/FLUID EXCHANGE Left 02/06/2019   Procedure: Gas/Fluid Exchange;  Surgeon: Bernarda Caffey, MD;  Location: [facility];  Service: Ophthalmology;  Laterality: Left;  C3F8  . PHOTOCOAGULATION WITH LASER Left 02/06/2019   Procedure: Photocoagulation With Laser;  Surgeon: Bernarda Caffey, MD;  Location: [facility];  Service: Ophthalmology;  Laterality: Left;  . TOOTH EXTRACTION Bilateral    wisdom toothe ext  . TUBAL LIGATION      FAMILY HISTORY Family History  Problem Relation Age of Onset  . COPD Father   . Heart disease Father   . Diabetes Sister   . Diabetes Sister     SOCIAL HISTORY Social History   Tobacco Use  . Smoking status: Former Smoker    Types: Cigarettes  . Smokeless tobacco: Former Systems developer  . Tobacco comment: quit 30 yrs ago per patient 02/06/19  Substance Use Topics  . Alcohol use: No  . Drug use: No         OPHTHALMIC EXAM:  Base Eye Exam    Visual Acuity (Snellen - Linear)      Right Left   Dist [place] 20/25 -2 CF at 3'   Dist ph [place] 20/20 -2 NI       Tonometry (Tonopen, 8:09 AM)      Right Left   Pressure  10       Pupils      Dark Light Shape React APD   Right 4 3 Round Brisk None   Left 7 7 Round none None       Extraocular Movement      Right Left    Full Full       Neuro/Psych    Oriented x3: Yes   Mood/Affect: Normal        Dilation    Left eye: 1.0% Mydriacyl, 2.5% Phenylephrine @ 8:05 AM        Slit Lamp and Fundus Exam    Slit Lamp Exam      Right Left   Lids/Lashes Mild Meibomian gland dysfunction Mild Meibomian gland dysfunction   Conjunctiva/Sclera White and  quiet Subconjunctival hemorrhage, sutures intact   Cornea Mild Arcus Mild Arcus, 1+ inferior Punctate epithelial erosions   Anterior Chamber Deep and quiet Deep, 2-3+ Cell/pigment   Iris Round and dilated Round and moderately dilated   Lens PC IOL in good position PC IOL in good position, 1+ IT Posterior capsular opacification   Vitreous Vitreous syneresis post vitrectomy, good gas fill       Fundus Exam      Right Left   Disc  pre-retinal heme overlying disc   C/D Ratio 0.5 0.5   Macula  Flat, attached, mac hole closing   Vessels  Vascular attenuation, mild Tortuousity   Periphery  Attached, pigmented choroidal lesion at 0300 with +drusen, no SRF or orange pigment, choroidal effusion inferiorly from 0500-0730          IMAGING AND PROCEDURES  Imaging and Procedures for _0 @           ASSESSMENT/PLAN:    ICD-10-CM   1. Macular hole of left eye  H35.342   2. Epiretinal membrane (ERM) of left eye  H35.372   3. Retinal edema  H35.81   4. 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 Pseudophakia of both eyes  Z96.1     1-3.  Macular Hole w/ ERM OS  - small full thickness macular hole  - +metamorphopsia, distortion centrally  - now POD5 s/p PPV/TissueBlue stain/MP/14% C3F8 OS, 12.10.20             - retina attached and good gas bubble in place w/ mac hole closing  - mild preretinal heme overlying disc  - anterior choroidal effusion inferiorly spanning 5-730             - IOP okay at 10  - good gas fill             - cont PF 6x/day OS -- use 6-8 times a day                         zymaxid QID OS -- stop when bottle runs out                         Atropine QD OS    Brimonidine QD OS                         PSO ung QID OS               - cont face down positioning 50% of time; avoid laying flat on back              - eye shield when sleeping              - post op drop and positioning instructions reviewed              - tylenol/ibuprofen for pain  - f/u early next week  4. Choroidal Nevus OS  - relatively flat pigmented lesion  - located at 0300 periphery with +drusen, no SRF/orange pigment  - no suspicious features  - discussed findngs, prognosis  - monitor  - baseline Optos images at next visit  5. Pseudophakia OU  - s/p CE/IOL OU (Dr. Herbert Deaner, 2016)  - s/p YAG capsulotomy OS (11.18.20)  - beautiful surgeries, doing well  - monitor   Ophthalmic Meds Ordered this visit:  Meds ordered this encounter  Medications  .  prednisoLONE acetate (PRED FORTE) 1 % ophthalmic suspension    Sig: Place 1 drop into the left eye 6 (six) times daily.    Dispense:  15 mL    Refill:  1       Return for f/u December 21/22 mac hole OS, DFE.  There are no Patient Instructions on file for this visit.   Explained the diagnoses, plan, and follow up with the patient and they expressed understanding.  Patient expressed understanding of the importance of proper follow up care.   This document serves as a record of services personally performed by Gardiner Sleeper, MD, PhD. It was created on their behalf by Roselee Nova, COMT. The creation of this record is the provider's dictation and/or activities during the visit.  This document serves as a record of services personally performed by Gardiner Sleeper, MD, PhD. It was created on their behalf by Ernest Mallick, OA, an ophthalmic assistant. The creation of this record is the provider's dictation and/or activities during the visit.    Electronically signed by: Ernest Mallick, OA 12.10.2020 8:44 AM   This document serves as a record of services personally performed by Gardiner Sleeper, MD, PhD. It was created on their behalf by Ernest Mallick, OA, an ophthalmic assistant. The  creation of this record is the provider's dictation and/or activities during the visit.    Electronically signed by: Ernest Mallick, OA 12.15.2020 8:44 AM   Gardiner Sleeper, M.D., Ph.D. Diseases & Surgery of the Retina and Vitreous [facility]  I have reviewed the above documentation for accuracy and completeness, and I agree with the above. Gardiner Sleeper, M.D., Ph.D. 02/13/19 8:44 AM    Abbreviations: M myopia (nearsighted); A astigmatism; H hyperopia (farsighted); P presbyopia; Mrx spectacle prescription;  CTL contact lenses; OD right eye; OS left eye; OU both eyes  XT exotropia; ET esotropia; PEK punctate epithelial keratitis; PEE punctate epithelial erosions; DES dry eye syndrome; MGD meibomian gland dysfunction; ATs artificial tears; PFAT's preservative free artificial tears; [place] nuclear sclerotic cataract; PSC posterior subcapsular cataract; ERM epi-retinal membrane; PVD posterior vitreous detachment; RD retinal detachment; DM diabetes mellitus; DR diabetic retinopathy; NPDR non-proliferative diabetic retinopathy; PDR proliferative diabetic retinopathy; CSME clinically significant macular edema; DME diabetic macular edema; dbh dot blot hemorrhages; CWS cotton wool spot; POAG primary open angle glaucoma; C/D cup-to-disc ratio; HVF humphrey visual field; GVF goldmann visual field; OCT optical coherence tomography; IOP intraocular pressure; BRVO Branch retinal vein occlusion; CRVO central retinal vein occlusion; CRAO central retinal artery occlusion; BRAO branch retinal artery occlusion; RT retinal tear; SB scleral buckle; PPV pars plana vitrectomy; VH Vitreous hemorrhage; PRP panretinal laser photocoagulation; IVK intravitreal kenalog; VMT vitreomacular traction; MH Macular hole;  NVD neovascularization of the disc; NVE neovascularization elsewhere; AREDS age related eye disease study; ARMD age related macular degeneration; POAG primary open angle glaucoma; EBMD  epithelial/anterior basement membrane dystrophy; ACIOL anterior chamber intraocular lens; IOL intraocular lens; PCIOL posterior chamber intraocular lens; Phaco/IOL phacoemulsification with intraocular lens placement; [place] photorefractive keratectomy; LASIK laser assisted in situ keratomileusis; HTN hypertension; DM diabetes mellitus; COPD chronic obstructive pulmonary disease

## 2019-02-17 NOTE — Progress Notes (Signed)
[facility] Note  02/18/2019     CHIEF COMPLAINT Patient presents for Post-op Follow-up   HISTORY OF PRESENT ILLNESS: Pamela Lang is a 68 y.o. female who presents to the clinic today for:   HPI    Post-op Follow-up    In left eye.  Vision is blurred at distance and is blurred at near.  I, the attending physician,  performed the HPI with the patient and updated documentation appropriately.          Comments    Pt states her vision is getting worse in the left eye.  Patient denies eye pain.  Patient denies any new or worsening floaters or fol in both eyes.       Last edited by Bernarda Caffey, MD on 02/18/2019  8:08 AM. (History)    pt states she feels like her vision is worse today, she has been using PF 8 times a day   Referring physician: [place], [place], [place], [place] [id number]    HISTORICAL INFORMATION:   Selected notes from the [facility] Patient referred by Dr. Parke Simmers to evaluate full thickness macular hole OS     CURRENT MEDICATIONS: Current Outpatient Medications (Ophthalmic Drugs)  Medication Sig  . atropine 1 % ophthalmic solution Place 1 drop into the left eye daily.  . bacitracin-polymyxin b (POLYSPORIN) ophthalmic ointment Place 1 application into the left eye 4 (four) times daily. apply to eye every 12 hours while awake  . brimonidine (ALPHAGAN) 0.2 % ophthalmic solution Place 1 drop into the left eye daily.  Marland Kitchen gatifloxacin (ZYMAXID) 0.5 % SOLN Place 1 drop into the left eye 4 (four) times daily.  . prednisoLONE acetate (PRED FORTE) 1 % ophthalmic suspension Place 1 drop into the left eye 6 (six) times daily.   No current facility-administered medications for this visit. (Ophthalmic Drugs)   Current Outpatient Medications (Other)  Medication Sig  . ALPRAZolam (XANAX) 0.25 MG tablet Take 0.25-0.5 mg by mouth every 6 (six) hours as needed for anxiety.  . famotidine (PEPCID) 20 MG  tablet Take 20 mg by mouth as needed for heartburn or indigestion.   No current facility-administered medications for this visit. (Other)      REVIEW OF SYSTEMS: ROS    Positive for: Cardiovascular, Eyes   Negative for: Constitutional, Gastrointestinal, Neurological, Skin, Genitourinary, Musculoskeletal, HENT, Endocrine, Respiratory, Psychiatric, Allergic/Imm, Heme/Lymph   Last edited by Doneen Poisson on 02/18/2019  8:00 AM. (History)       ALLERGIES Allergies  Allergen Reactions  . Ceclor [Cefaclor] Nausea And Vomiting and Other (See Comments)    Severe stomach cramps  . Cortisone Other (See Comments)    TIA  . Propranolol Other (See Comments)    Decreased heart rate  . Aspirin Other (See Comments)    Irritates ulcers   . Augmentin [Amoxicillin-Pot Clavulanate] Nausea And Vomiting  . Ciprodex [Ciprofloxacin-Dexamethasone]     Migraines  . Codeine Nausea And Vomiting  . Darvocet [Propoxyphene N-Acetaminophen] Nausea And Vomiting  . Diazepam     Tachycardia   . Doxycycline Nausea And Vomiting  . Omnaris [Ciclesonide]     Migraines   . Other     ALL STEROIDS CAUSE TACHYCARDIA   . Sulfa Antibiotics Other (See Comments)    Migraine headache  . Sumatriptan Other (See Comments)    Tachycardia   . Zofran [Ondansetron Hcl]     Severe Migraines, Tachycardia   .  Zoloft [Sertraline Hcl]     Tachycardia     PAST MEDICAL HISTORY Past Medical History:  Diagnosis Date  . Anxiety   . Eczema    ear  . GERD (gastroesophageal reflux disease)    diet controlled  . Headache    occasional - last one in 2019  . IBS (irritable bowel syndrome)   . MVP (mitral valve prolapse)    no problems  . Palpitations    EKG 12/24/18 and ECHO 01/22/19 normal in [facility]  - rx Diltiazem cd 120 mg qd but has not started until after 02/06/19 procedure with  Coralyn Pear  . PONV (postoperative nausea and vomiting)   . SVT (supraventricular tachycardia) ([place])   . TIA (transient ischemic attack) 2012    TIA   Past Surgical History:  Procedure Laterality Date  . [place] VITRECTOMY WITH 20 GAUGE MVR PORT FOR MACULAR HOLE Left 02/06/2019   Procedure: 25 GAUGE PARS PLANA VITRECTOMY WITH 20 GAUGE MVR PORT FOR MACULAR HOLE;  Surgeon: Bernarda Caffey, MD;  Location: [facility];  Service: Ophthalmology;  Laterality: Left;  . BREAST CYST ASPIRATION Right   . CATARACT EXTRACTION Bilateral 2016   Dr. Herbert Deaner  . CESAREAN SECTION     x 2  . COLONOSCOPY    . Cyst B/L Wrist Bilateral   . GAS/FLUID EXCHANGE Left 02/06/2019   Procedure: Gas/Fluid Exchange;  Surgeon: Bernarda Caffey, MD;  Location: [facility];  Service: Ophthalmology;  Laterality: Left;  C3F8  . PHOTOCOAGULATION WITH LASER Left 02/06/2019   Procedure: Photocoagulation With Laser;  Surgeon: Bernarda Caffey, MD;  Location: [facility];  Service: Ophthalmology;  Laterality: Left;  . TOOTH EXTRACTION Bilateral    wisdom toothe ext  . TUBAL LIGATION      FAMILY HISTORY Family History  Problem Relation Age of Onset  . COPD Father   . Heart disease Father   . Diabetes Sister   . Diabetes Sister     SOCIAL HISTORY Social History   Tobacco Use  . Smoking status: Former Smoker    Types: Cigarettes  . Smokeless tobacco: Former Systems developer  . Tobacco comment: quit 30 yrs ago per patient 02/06/19  Substance Use Topics  . Alcohol use: No  . Drug use: No         OPHTHALMIC EXAM:  Base Eye Exam    Visual Acuity (Snellen - Linear)      Right Left   Dist [place] 20/25 +1 CF @ face   Dist ph [place] 20/20 -2 NI       Tonometry (Tonopen, 8:05 AM)      Right Left   Pressure 16 13       Pupils      Dark Light Shape React APD   Right 3 2 Round Brisk 0   Left Dilated           Visual Fields      Left Right     Full   Restrictions Partial outer superior temporal, inferior temporal, superior nasal, inferior nasal deficiencies        Extraocular Movement      Right Left    Full Full       Neuro/Psych    Oriented x3: Yes   Mood/Affect:  Normal       Dilation    Left eye: 1.0% Mydriacyl, 2.5% Phenylephrine @ 8:05 AM        Slit Lamp and Fundus Exam    Slit Lamp Exam  Right Left   Lids/Lashes Mild Meibomian gland dysfunction Mild Meibomian gland dysfunction   Conjunctiva/Sclera White and quiet Subconjunctival hemorrhage, sutures dissolving   Cornea Mild Arcus Mild Arcus, trace inferior Punctate epithelial erosions   Anterior Chamber Deep and quiet Deep and quiet   Iris Round and dilated Round and moderately dilated   Lens PC IOL in good position PC IOL in good position, 1+ IT Posterior capsular opacification   Vitreous Vitreous syneresis post vitrectomy, gas bubble ~70%; mild VH inferiorly       Fundus Exam      Right Left   Disc  Trace pre-retinal heme overlying disc -- improved from prior   C/D Ratio 0.5 0.5   Macula  Flat, attached, mac hole closing   Vessels  Vascular attenuation, mild Tortuousity   Periphery  Attached, very hazy view inferiorly--+VH, pigmented choroidal lesion at 0300 with +drusen, no SRF or orange pigment          IMAGING AND PROCEDURES  Imaging and Procedures for _0 @           ASSESSMENT/PLAN:    ICD-10-CM   1. Macular hole of left eye  H35.342   2. Epiretinal membrane (ERM) of left eye  H35.372   3. Retinal edema  H35.81   4. 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 Pseudophakia of both eyes  Z96.1   6. Left posterior capsular opacification  H26.492     1-3.  Macular Hole w/ ERM OS  - small full thickness macular hole  - +metamorphopsia, distortion centrally  - now POD12 s/p PPV/TissueBlue stain/MP/14% C3F8 OS, 12.10.20             - retina attached and good gas bubble in place w/ mac hole closing  - mild preretinal heme overlying disc improving  - mild inferior VH             - IOP okay at 13  - gas bubble ~70%             - cont PF 8x/day OS                          zymaxid QID OS -- stop when bottle runs out                         Atropine QD OS --  increase to BID    Brimonidine QD -- okay to stop                         PSO ung QID OS              - cont face down positioning 50% of time; avoid laying flat on back              - eye shield when sleeping              - post op drop and positioning instructions reviewed              - tylenol/ibuprofen for pain  - f/u March 05, 2019  4. Choroidal Nevus OS  - relatively flat pigmented lesion  - located at 0300 periphery with +drusen, no SRF/orange pigment  - no suspicious features  - discussed findngs, prognosis  - monitor  - baseline Optos images today (12.22.20)  5,6. Pseudophakia OU  - s/p CE/IOL OU (  Dr. Herbert Deaner, 2016)  - s/p YAG capsulotomy OS (11.18.20)  - beautiful surgeries, doing well  - monitor   Ophthalmic Meds Ordered this visit:  No orders of the defined types were placed in this encounter.      Return for f/u January 6 mac hole OS, DFE, OCT.  There are no Patient Instructions on file for this visit.   Explained the diagnoses, plan, and follow up with the patient and they expressed understanding.  Patient expressed understanding of the importance of proper follow up care.   This document serves as a record of services personally performed by Gardiner Sleeper, MD, PhD. It was created on their behalf by Estill Bakes, COT an ophthalmic technician. The creation of this record is the provider's dictation and/or activities during the visit.    Electronically signed by: Estill Bakes, COT 02/17/19 @ 1:24 AM   This document serves as a record of services personally performed by Gardiner Sleeper, MD, PhD. It was created on their behalf by Ernest Mallick, OA, an ophthalmic assistant. The creation of this record is the provider's dictation and/or activities during the visit.    Electronically signed by: Ernest Mallick, OA 12.22.2020 1:24 AM  Gardiner Sleeper, M.D., Ph.D. Diseases & Surgery of the Retina and [place] 02/18/2019   I  have reviewed the above documentation for accuracy and completeness, and I agree with the above. Gardiner Sleeper, M.D., Ph.D. 02/20/19 1:24 AM    Abbreviations: M myopia (nearsighted); A astigmatism; H hyperopia (farsighted); P presbyopia; Mrx spectacle prescription;  CTL contact lenses; OD right eye; OS left eye; OU both eyes  XT exotropia; ET esotropia; PEK punctate epithelial keratitis; PEE punctate epithelial erosions; DES dry eye syndrome; MGD meibomian gland dysfunction; ATs artificial tears; PFAT's preservative free artificial tears; [place] nuclear sclerotic cataract; PSC posterior subcapsular cataract; ERM epi-retinal membrane; PVD posterior vitreous detachment; RD retinal detachment; DM diabetes mellitus; DR diabetic retinopathy; NPDR non-proliferative diabetic retinopathy; PDR proliferative diabetic retinopathy; CSME clinically significant macular edema; DME diabetic macular edema; dbh dot blot hemorrhages; CWS cotton wool spot; POAG primary open angle glaucoma; C/D cup-to-disc ratio; HVF humphrey visual field; GVF goldmann visual field; OCT optical coherence tomography; IOP intraocular pressure; BRVO Branch retinal vein occlusion; CRVO central retinal vein occlusion; CRAO central retinal artery occlusion; BRAO branch retinal artery occlusion; RT retinal tear; SB scleral buckle; PPV pars plana vitrectomy; VH Vitreous hemorrhage; PRP panretinal laser photocoagulation; IVK intravitreal kenalog; VMT vitreomacular traction; MH Macular hole;  NVD neovascularization of the disc; NVE neovascularization elsewhere; AREDS age related eye disease study; ARMD age related macular degeneration; POAG primary open angle glaucoma; EBMD epithelial/anterior basement membrane dystrophy; ACIOL anterior chamber intraocular lens; IOL intraocular lens; PCIOL posterior chamber intraocular lens; Phaco/IOL phacoemulsification with intraocular lens placement; [place] photorefractive keratectomy; LASIK laser assisted in situ  keratomileusis; HTN hypertension; DM diabetes mellitus; COPD chronic obstructive pulmonary disease

## 2019-02-18 ENCOUNTER — Ambulatory Visit (INDEPENDENT_AMBULATORY_CARE_PROVIDER_SITE_OTHER): Payer: Medicare Other | Admitting: Ophthalmology

## 2019-02-18 DIAGNOSIS — H26492 Other secondary cataract, left eye: Secondary | ICD-10-CM

## 2019-02-18 DIAGNOSIS — H35342 Macular cyst, hole, or pseudohole, left eye: Secondary | ICD-10-CM

## 2019-02-18 DIAGNOSIS — H35372 Puckering of macula, left eye: Secondary | ICD-10-CM

## 2019-02-18 DIAGNOSIS — Z961 Presence of intraocular lens: Secondary | ICD-10-CM

## 2019-02-18 DIAGNOSIS — H3581 Retinal edema: Secondary | ICD-10-CM

## 2019-02-18 DIAGNOSIS — D3132 Benign neoplasm of left choroid: Secondary | ICD-10-CM

## 2019-02-19 ENCOUNTER — Telehealth (INDEPENDENT_AMBULATORY_CARE_PROVIDER_SITE_OTHER): Payer: Self-pay

## 2019-02-20 ENCOUNTER — Encounter (INDEPENDENT_AMBULATORY_CARE_PROVIDER_SITE_OTHER): Payer: Self-pay | Admitting: Ophthalmology

## 2019-03-04 NOTE — Progress Notes (Signed)
[facility] Note  03/05/2019     CHIEF COMPLAINT Patient presents for Post-op Follow-up   HISTORY OF PRESENT ILLNESS: Pamela Lang is a 69 y.o. female who presents to the clinic today for:   HPI    Post-op Follow-up    In left eye.  I, the attending physician,  performed the HPI with the patient and updated documentation appropriately.          Comments    Patient states vision still hazy OS. Still sees bubble in OS. Using PF 8 times daily, atropine 2 times daily, and polysporin ointment 4 times daily OS.        Last edited by Bernarda Caffey, MD on 03/05/2019  8:20 AM. (History)    pt states she has been doing a lot of face down positioning, she states she doesn't feel like the bubble has gotten any smaller since her last visit, but she does feel like her vision is improving   Referring physician: [place], [place], [place], [place] [address]   HISTORICAL INFORMATION:   Selected notes from the [facility] Patient referred by Dr. Parke Simmers to evaluate full thickness macular hole OS     CURRENT MEDICATIONS: Current Outpatient Medications (Ophthalmic Drugs)  Medication Sig  . atropine 1 % ophthalmic solution Place 1 drop into the left eye 2 (two) times daily.   . bacitracin-polymyxin b (POLYSPORIN) ophthalmic ointment Place 1 application into the left eye 4 (four) times daily. apply to eye every 12 hours while awake  . prednisoLONE acetate (PRED FORTE) 1 % ophthalmic suspension Place 1 drop into the left eye 6 (six) times daily. (Patient taking differently: Place 1 drop into the left eye 2 (two) times daily. )  . brimonidine (ALPHAGAN) 0.2 % ophthalmic solution Place 1 drop into the left eye daily.  Marland Kitchen gatifloxacin (ZYMAXID) 0.5 % SOLN Place 1 drop into the left eye 4 (four) times daily.   No current facility-administered medications for this visit. (Ophthalmic Drugs)   Current Outpatient Medications (Other)   Medication Sig  . famotidine (PEPCID) 20 MG tablet Take 20 mg by mouth as needed for heartburn or indigestion.  Marland Kitchen ALPRAZolam (XANAX) 0.25 MG tablet Take 0.25-0.5 mg by mouth every 6 (six) hours as needed for anxiety.   No current facility-administered medications for this visit. (Other)      REVIEW OF SYSTEMS: ROS    Positive for: Cardiovascular, Eyes   Negative for: Constitutional, Gastrointestinal, Neurological, Skin, Genitourinary, Musculoskeletal, HENT, Endocrine, Respiratory, Psychiatric, Allergic/Imm, Heme/Lymph   Last edited by Roselee Nova D, COT on 03/05/2019  8:11 AM. (History)       ALLERGIES Allergies  Allergen Reactions  . Ceclor [Cefaclor] Nausea And Vomiting and Other (See Comments)    Severe stomach cramps  . Cortisone Other (See Comments)    TIA  . Propranolol Other (See Comments)    Decreased heart rate  . Aspirin Other (See Comments)    Irritates ulcers   . Augmentin [Amoxicillin-Pot Clavulanate] Nausea And Vomiting  . Ciprodex [Ciprofloxacin-Dexamethasone]     Migraines  . Codeine Nausea And Vomiting  . Darvocet [Propoxyphene N-Acetaminophen] Nausea And Vomiting  . Diazepam     Tachycardia   . Doxycycline Nausea And Vomiting  . Omnaris [Ciclesonide]     Migraines   . Other     ALL STEROIDS CAUSE TACHYCARDIA   . Sulfa Antibiotics Other (See Comments)    Migraine headache  .  Sumatriptan Other (See Comments)    Tachycardia   . Zofran [Ondansetron Hcl]     Severe Migraines, Tachycardia   . Zoloft [Sertraline Hcl]     Tachycardia     PAST MEDICAL HISTORY Past Medical History:  Diagnosis Date  . Anxiety   . Eczema    ear  . GERD (gastroesophageal reflux disease)    diet controlled  . Headache    occasional - last one in 2019  . IBS (irritable bowel syndrome)   . MVP (mitral valve prolapse)    no problems  . Palpitations    EKG 12/24/18 and ECHO 01/22/19 normal in [facility]  - rx Diltiazem cd 120 mg qd but has not started until after 02/06/19  procedure with  Coralyn Pear  . PONV (postoperative nausea and vomiting)   . SVT (supraventricular tachycardia) ([place])   . TIA (transient ischemic attack) 2012   TIA   Past Surgical History:  Procedure Laterality Date  . [place] VITRECTOMY WITH 20 GAUGE MVR PORT FOR MACULAR HOLE Left 02/06/2019   Procedure: 25 GAUGE PARS PLANA VITRECTOMY WITH 20 GAUGE MVR PORT FOR MACULAR HOLE;  Surgeon: Bernarda Caffey, MD;  Location: [facility];  Service: Ophthalmology;  Laterality: Left;  . BREAST CYST ASPIRATION Right   . CATARACT EXTRACTION Bilateral 2016   Dr. Herbert Deaner  . CESAREAN SECTION     x 2  . COLONOSCOPY    . Cyst B/L Wrist Bilateral   . GAS/FLUID EXCHANGE Left 02/06/2019   Procedure: Gas/Fluid Exchange;  Surgeon: Bernarda Caffey, MD;  Location: [facility];  Service: Ophthalmology;  Laterality: Left;  C3F8  . PHOTOCOAGULATION WITH LASER Left 02/06/2019   Procedure: Photocoagulation With Laser;  Surgeon: Bernarda Caffey, MD;  Location: [facility];  Service: Ophthalmology;  Laterality: Left;  . TOOTH EXTRACTION Bilateral    wisdom toothe ext  . TUBAL LIGATION      FAMILY HISTORY Family History  Problem Relation Age of Onset  . COPD Father   . Heart disease Father   . Diabetes Sister   . Diabetes Sister     SOCIAL HISTORY Social History   Tobacco Use  . Smoking status: Former Smoker    Types: Cigarettes  . Smokeless tobacco: Former Systems developer  . Tobacco comment: quit 30 yrs ago per patient 02/06/19  Substance Use Topics  . Alcohol use: No  . Drug use: No         OPHTHALMIC EXAM:  Base Eye Exam    Visual Acuity (Snellen - Linear)      Right Left   Dist [place] 20/25 20/70 +2   Dist ph [place] 20/20 -1 NI       Tonometry (Tonopen, 8:21 AM)      Right Left   Pressure 16 12       Pupils      Dark Light Shape React APD   Right 4 3 Round Brisk None   Left 8 8 Round None None       Visual Fields (Counting fingers)      Left Right     Full   Restrictions Partial outer superior nasal,  inferior nasal deficiencies        Extraocular Movement      Right Left    Full, Ortho Full, Ortho       Neuro/Psych    Oriented x3: Yes   Mood/Affect: Normal       Dilation    Both eyes: 1.0% Mydriacyl, 2.5% Phenylephrine @  8:25 AM        Slit Lamp and Fundus Exam    Slit Lamp Exam      Right Left   Lids/Lashes Mild Meibomian gland dysfunction Mild Meibomian gland dysfunction   Conjunctiva/Sclera White and quiet sutures dissolving, trace injection around suture sites   Cornea Mild Arcus Mild Arcus, trace inferior Punctate epithelial erosions   Anterior Chamber Deep and quiet Deep and quiet   Iris Round and dilated Round and dilated   Lens PC IOL in good position PC IOL in good position, 1+ IT Posterior capsular opacification   Vitreous Vitreous syneresis post vitrectomy, gas bubble ~40%;  2-3+pigment / RBC       Fundus Exam      Right Left   Disc Pink and Sharp Pink and Sharp, trace hazy view -- preretinal heme cleared   C/D Ratio 0.5 0.5   Macula Flat, Blunted foveal reflex, trace Epiretinal membrane Flat, mac hole closed, No heme or edema   Vessels Mild Vascular attenuation, mild Tortuousity, mild AV crossing changes Vascular attenuation, mild Tortuousity   Periphery Attached, no heme    Attached, hazy view from [facility], pigmented choroidal lesion at 0300 with +drusen, no SRF or orange pigment        Refraction    Manifest Refraction      Sphere Cylinder Axis Dist VA   Right       Left -1.00 +0.50 180 20/70+1          IMAGING AND PROCEDURES  Imaging and Procedures for _0 @  OCT, Retina - OU - Both Eyes       Right Eye Quality was good. Central Foveal Thickness: 259. Progression has been stable. Findings include normal foveal contour, no IRF, no SRF, vitreomacular adhesion , epiretinal membrane.   Left Eye Quality was good. Central Foveal Thickness: 306. Progression has improved. Findings include no SRF, normal foveal contour, no IRF (Mac hole closed;  trace central ellipsoid disruption).   Notes *Images captured and stored on drive  Diagnosis / Impression:  OD: NFP, no IRF/SRF OS: Mac hole closed; trace central ellipsoid disruption  Clinical management:  See below  Abbreviations: NFP - Normal foveal profile. CME - cystoid macular edema. PED - pigment epithelial detachment. IRF - intraretinal fluid. SRF - subretinal fluid. EZ - ellipsoid zone. ERM - epiretinal membrane. ORA - outer retinal atrophy. ORT - outer retinal tubulation. SRHM - subretinal hyper-reflective material                 ASSESSMENT/PLAN:    ICD-10-CM   1. Macular hole of left eye  H35.342   2. Epiretinal membrane (ERM) of left eye  H35.372   3. Retinal edema  H35.81 OCT, Retina - OU - Both Eyes  4. 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 Pseudophakia of both eyes  Z96.1   6. Left posterior capsular opacification  H26.492     1-3.  Macular Hole w/ ERM OS  - small full thickness macular hole  - +metamorphopsia, distortion centrally  - s/p PPV/TissueBlue stain/MP/14% C3F8 OS, 12.10.20  - mac hole closed             - retina attached and 40% gas bubble in place  - mild preretinal heme overlying disc resolved  - mild inferior VH clearing             - IOP okay at 12  - gas bubble ~40%             -  cont PF 8x/day OS -- decrease to 6x/day                         Atropine QD OS -- okay to stop                         PSO ung QID OS -- okay to stop             - avoid laying flat on back              - post op drop and positioning instructions reviewed   - VH precautions reviewed -- minimize activities, keep head elevated, avoid ASA/NSAIDs/blood thinners as able              - tylenol/ibuprofen for pain  - f/u 2 weeks  4. Choroidal Nevus OS  - relatively flat pigmented lesion  - located at 0300 periphery with +drusen, no SRF/orange pigment  - no suspicious features  - discussed findngs, prognosis  - baseline images done on 12.22.20  -  monitor   5,6. Pseudophakia OU  - s/p CE/IOL OU (Dr. Herbert Deaner, 2016)  - s/p YAG capsulotomy OS (11.18.20)  - beautiful surgeries, doing well  - monitor   Ophthalmic Meds Ordered this visit:  No orders of the defined types were placed in this encounter.      Return in about 2 weeks (around 03/19/2019) for f/u mac hole OS, DFE, OCT.  There are no Patient Instructions on file for this visit.   Explained the diagnoses, plan, and follow up with the patient and they expressed understanding.  Patient expressed understanding of the importance of proper follow up care.   This document serves as a record of services personally performed by Gardiner Sleeper, MD, PhD. It was created on their behalf by Roselee Nova, COMT. The creation of this record is the provider's dictation and/or activities during the visit.  Electronically signed by: Roselee Nova, COMT 03/05/19 9:35 AM   This document serves as a record of services personally performed by Gardiner Sleeper, MD, PhD. It was created on their behalf by Ernest Mallick, OA, an ophthalmic assistant. The creation of this record is the provider's dictation and/or activities during the visit.    Electronically signed by: Ernest Mallick, OA 01.06.2021 9:35 AM  Gardiner Sleeper, M.D., Ph.D. Diseases & Surgery of the Retina and [place] 03/05/2019   I have reviewed the above documentation for accuracy and completeness, and I agree with the above. Gardiner Sleeper, M.D., Ph.D. 03/05/19 9:35 AM   Abbreviations: M myopia (nearsighted); A astigmatism; H hyperopia (farsighted); P presbyopia; Mrx spectacle prescription;  CTL contact lenses; OD right eye; OS left eye; OU both eyes  XT exotropia; ET esotropia; PEK punctate epithelial keratitis; PEE punctate epithelial erosions; DES dry eye syndrome; MGD meibomian gland dysfunction; ATs artificial tears; PFAT's preservative free artificial tears; [place] nuclear sclerotic cataract; PSC  posterior subcapsular cataract; ERM epi-retinal membrane; PVD posterior vitreous detachment; RD retinal detachment; DM diabetes mellitus; DR diabetic retinopathy; NPDR non-proliferative diabetic retinopathy; PDR proliferative diabetic retinopathy; CSME clinically significant macular edema; DME diabetic macular edema; dbh dot blot hemorrhages; CWS cotton wool spot; POAG primary open angle glaucoma; C/D cup-to-disc ratio; HVF humphrey visual field; GVF goldmann visual field; OCT optical coherence tomography; IOP intraocular pressure; BRVO Branch retinal vein occlusion; CRVO central retinal vein occlusion; CRAO central retinal artery occlusion;  BRAO branch retinal artery occlusion; RT retinal tear; SB scleral buckle; PPV pars plana vitrectomy; VH Vitreous hemorrhage; PRP panretinal laser photocoagulation; IVK intravitreal kenalog; VMT vitreomacular traction; MH Macular hole;  NVD neovascularization of the disc; NVE neovascularization elsewhere; AREDS age related eye disease study; ARMD age related macular degeneration; POAG primary open angle glaucoma; EBMD epithelial/anterior basement membrane dystrophy; ACIOL anterior chamber intraocular lens; IOL intraocular lens; PCIOL posterior chamber intraocular lens; Phaco/IOL phacoemulsification with intraocular lens placement; [place] photorefractive keratectomy; LASIK laser assisted in situ keratomileusis; HTN hypertension; DM diabetes mellitus; COPD chronic obstructive pulmonary disease

## 2019-03-05 ENCOUNTER — Encounter (INDEPENDENT_AMBULATORY_CARE_PROVIDER_SITE_OTHER): Payer: Self-pay | Admitting: Ophthalmology

## 2019-03-05 ENCOUNTER — Other Ambulatory Visit: Payer: Self-pay

## 2019-03-05 ENCOUNTER — Ambulatory Visit (INDEPENDENT_AMBULATORY_CARE_PROVIDER_SITE_OTHER): Payer: Medicare PPO | Admitting: Ophthalmology

## 2019-03-05 DIAGNOSIS — H35372 Puckering of macula, left eye: Secondary | ICD-10-CM

## 2019-03-05 DIAGNOSIS — H35342 Macular cyst, hole, or pseudohole, left eye: Secondary | ICD-10-CM

## 2019-03-05 DIAGNOSIS — H26492 Other secondary cataract, left eye: Secondary | ICD-10-CM

## 2019-03-05 DIAGNOSIS — D3132 Benign neoplasm of left choroid: Secondary | ICD-10-CM

## 2019-03-05 DIAGNOSIS — Z961 Presence of intraocular lens: Secondary | ICD-10-CM

## 2019-03-05 DIAGNOSIS — H3581 Retinal edema: Secondary | ICD-10-CM

## 2019-03-12 NOTE — Progress Notes (Signed)
[facility] Note  03/20/2019     CHIEF COMPLAINT Patient presents for Retina Follow Up   HISTORY OF PRESENT ILLNESS: Pamela Lang is a 69 y.o. female who presents to the clinic today for:   HPI    Retina Follow Up    Patient presents with  Other.  In left eye.  This started weeks ago.  Severity is moderate.  Duration of weeks.  Since onset it is gradually improving.  I, the attending physician,  performed the HPI with the patient and updated documentation appropriately.          Comments    Pt states her vision is gradually improving OS.  Patient denies eye pain or discomfort.  Patient has occasional floaters OS and denies fol.       Last edited by Bernarda Caffey, MD on 03/20/2019  8:05 AM. (History)    pt states the gas bubble is much smaller and she feels like her vision is getting better, pt has an appt on February 25th for her regular eye exam, pt is using PF 6x/day  Referring physician: [place], [place], [address] [place], [place] [address]   HISTORICAL INFORMATION:   Selected notes from the [place] Patient referred by Dr. Parke Simmers to evaluate full thickness macular hole OS     CURRENT MEDICATIONS: Current Outpatient Medications (Ophthalmic Drugs)  Medication Sig  . atropine 1 % ophthalmic solution Place 1 drop into the left eye 2 (two) times daily.   . bacitracin-polymyxin b (POLYSPORIN) ophthalmic ointment Place 1 application into the left eye 4 (four) times daily. apply to eye every 12 hours while awake  . brimonidine (ALPHAGAN) 0.2 % ophthalmic solution Place 1 drop into the left eye daily.  Marland Kitchen gatifloxacin (ZYMAXID) 0.5 % SOLN Place 1 drop into the left eye 4 (four) times daily.  . prednisoLONE acetate (PRED FORTE) 1 % ophthalmic suspension Place 1 drop into the left eye 4 (four) times daily.   No current facility-administered medications for this visit. (Ophthalmic Drugs)   Current Outpatient  Medications (Other)  Medication Sig  . ALPRAZolam (XANAX) 0.25 MG tablet Take 0.25-0.5 mg by mouth every 6 (six) hours as needed for anxiety.  . famotidine (PEPCID) 20 MG tablet Take 20 mg by mouth as needed for heartburn or indigestion.   No current facility-administered medications for this visit. (Other)      REVIEW OF SYSTEMS: ROS    Positive for: Cardiovascular, Eyes   Negative for: Constitutional, Gastrointestinal, Neurological, Skin, Genitourinary, Musculoskeletal, HENT, Endocrine, Respiratory, Psychiatric, Allergic/Imm, Heme/Lymph   Last edited by Doneen Poisson on 03/20/2019  7:46 AM. (History)       ALLERGIES Allergies  Allergen Reactions  . Ceclor [Cefaclor] Nausea And Vomiting and Other (See Comments)    Severe stomach cramps  . Cortisone Other (See Comments)    TIA  . Propranolol Other (See Comments)    Decreased heart rate  . Aspirin Other (See Comments)    Irritates ulcers   . Augmentin [Amoxicillin-Pot Clavulanate] Nausea And Vomiting  . Ciprodex [Ciprofloxacin-Dexamethasone]     Migraines  . Codeine Nausea And Vomiting  . Darvocet [Propoxyphene N-Acetaminophen] Nausea And Vomiting  . Diazepam     Tachycardia   . Doxycycline Nausea And Vomiting  . Omnaris [Ciclesonide]     Migraines   . Other     ALL STEROIDS CAUSE TACHYCARDIA   . Sulfa Antibiotics Other (See Comments)  Migraine headache  . Sumatriptan Other (See Comments)    Tachycardia   . Zofran [Ondansetron Hcl]     Severe Migraines, Tachycardia   . Zoloft [Sertraline Hcl]     Tachycardia     PAST MEDICAL HISTORY Past Medical History:  Diagnosis Date  . Anxiety   . Eczema    ear  . GERD (gastroesophageal reflux disease)    diet controlled  . Headache    occasional - last one in 2019  . IBS (irritable bowel syndrome)   . MVP (mitral valve prolapse)    no problems  . Palpitations    EKG 12/24/18 and ECHO 01/22/19 normal in [facility]  - rx Diltiazem cd 120 mg qd but has not started  until after 02/06/19 procedure with  Coralyn Pear  . PONV (postoperative nausea and vomiting)   . SVT (supraventricular tachycardia) ([place])   . TIA (transient ischemic attack) 2012   TIA   Past Surgical History:  Procedure Laterality Date  . [place] VITRECTOMY WITH 20 GAUGE MVR PORT FOR MACULAR HOLE Left 02/06/2019   Procedure: 25 GAUGE PARS PLANA VITRECTOMY WITH 20 GAUGE MVR PORT FOR MACULAR HOLE;  Surgeon: Bernarda Caffey, MD;  Location: [facility];  Service: Ophthalmology;  Laterality: Left;  . BREAST CYST ASPIRATION Right   . CATARACT EXTRACTION Bilateral 2016   Dr. Herbert Deaner  . CESAREAN SECTION     x 2  . COLONOSCOPY    . Cyst B/L Wrist Bilateral   . GAS/FLUID EXCHANGE Left 02/06/2019   Procedure: Gas/Fluid Exchange;  Surgeon: Bernarda Caffey, MD;  Location: [facility];  Service: Ophthalmology;  Laterality: Left;  C3F8  . PHOTOCOAGULATION WITH LASER Left 02/06/2019   Procedure: Photocoagulation With Laser;  Surgeon: Bernarda Caffey, MD;  Location: [facility];  Service: Ophthalmology;  Laterality: Left;  . TOOTH EXTRACTION Bilateral    wisdom toothe ext  . TUBAL LIGATION      FAMILY HISTORY Family History  Problem Relation Age of Onset  . COPD Father   . Heart disease Father   . Diabetes Sister   . Diabetes Sister     SOCIAL HISTORY Social History   Tobacco Use  . Smoking status: Former Smoker    Types: Cigarettes  . Smokeless tobacco: Former Systems developer  . Tobacco comment: quit 30 yrs ago per patient 02/06/19  Substance Use Topics  . Alcohol use: No  . Drug use: No         OPHTHALMIC EXAM:  Base Eye Exam    Visual Acuity (Snellen - Linear)      Right Left   Dist [place] 20/20 20/40 +1   Dist ph [place]  NI       Tonometry (Tonopen, 7:50 AM)      Right Left   Pressure 15 14       Pupils      Dark Light Shape React APD   Right 3 2 Round Minimal 0   Left 5 4 Round Minimal 0       Visual Fields      Left Right    Full Full       Extraocular Movement      Right Left    Full  Full       Neuro/Psych    Oriented x3: Yes   Mood/Affect: Normal       Dilation    Both eyes: 1.0% Mydriacyl, 2.5% Phenylephrine @ 7:51 AM        Slit Lamp  and Fundus Exam    Slit Lamp Exam      Right Left   Lids/Lashes Mild Meibomian gland dysfunction Mild Meibomian gland dysfunction   Conjunctiva/Sclera White and quiet sutures dissolving, trace injection temporally   Cornea Mild Arcus Clear   Anterior Chamber Deep and quiet Deep and quiet   Iris Round and dilated Round and dilated   Lens PC IOL in good position PC IOL in good position with open PC   Vitreous Vitreous syneresis post vitrectomy, gas bubble ~10-15%;  +pigment / RBC       Fundus Exam      Right Left   Disc Pink and Sharp Pink and Sharp, trace hazy view -- preretinal heme cleared   C/D Ratio 0.5 0.5   Macula Flat, Blunted foveal reflex, trace Epiretinal membrane Flat, blunted foveal reflex, mac hole closed, Retinal pigment epithelial mottling, No heme or edema   Vessels Mild Vascular attenuation, mild Tortuousity, mild AV crossing changes Vascular attenuation, mild Tortuousity   Periphery Attached, no heme    Attached, hazy view from [facility], pigmented choroidal lesion (nevus) at 0300 with +drusen, no SRF or orange pigment          IMAGING AND PROCEDURES  Imaging and Procedures for _0 @  OCT, Retina - OU - Both Eyes       Right Eye Quality was good. Central Foveal Thickness: 255. Progression has been stable. Findings include normal foveal contour, no IRF, no SRF, vitreomacular adhesion , epiretinal membrane.   Left Eye Quality was good. Central Foveal Thickness: 293. Progression has improved. Findings include no SRF, normal foveal contour, no IRF (Mac hole closed; mild interval improvement in central ellipsoid disruption; vitreous opacities).   Notes *Images captured and stored on drive  Diagnosis / Impression:  OD: NFP, no IRF/SRF OS: Mac hole closed; mild interval improvement in central ellipsoid  disruption; vitreous opacities   Clinical management:  See below  Abbreviations: NFP - Normal foveal profile. CME - cystoid macular edema. PED - pigment epithelial detachment. IRF - intraretinal fluid. SRF - subretinal fluid. EZ - ellipsoid zone. ERM - epiretinal membrane. ORA - outer retinal atrophy. ORT - outer retinal tubulation. SRHM - subretinal hyper-reflective material                 ASSESSMENT/PLAN:    ICD-10-CM   1. Macular hole of left eye  H35.342   2. Epiretinal membrane (ERM) of left eye  H35.372   3. Retinal edema  H35.81 OCT, Retina - OU - Both Eyes  4. 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 Pseudophakia of both eyes  Z96.1   6. Left posterior capsular opacification  H26.492     1-3.  Macular Hole w/ ERM OS  - small full thickness macular hole  - +metamorphopsia, distortion centrally  - s/p PPV/TissueBlue stain/MP/14% C3F8 OS, 12.10.20  - mac hole closed             - retina attached and residual gas bubble ~10-15%  - mild preretinal heme overlying disc resolved  - mild inferior VH clearing             - IOP okay at 14             - dec PF to QID OS             - avoid laying flat on back              -  post op drop and positioning instructions reviewed   - VH precautions reviewed -- minimize activities, keep head elevated, avoid ASA/NSAIDs/blood thinners as able   - f/u 4 weeks  4. Choroidal Nevus OS  - relatively flat pigmented lesion  - located at 0300 periphery with +drusen, no SRF/orange pigment  - no suspicious features  - discussed findngs, prognosis  - baseline images done on 12.22.20  - monitor   5,6. Pseudophakia OU  - s/p CE/IOL OU (Dr. Herbert Deaner, 2016)  - s/p YAG capsulotomy OS (11.18.20)  - beautiful surgeries, doing well  - monitor   Ophthalmic Meds Ordered this visit:  Meds ordered this encounter  Medications  . prednisoLONE acetate (PRED FORTE) 1 % ophthalmic suspension    Sig: Place 1 drop into the left eye 4 (four) times  daily.    Dispense:  15 mL    Refill:  1       Return in about 4 weeks (around 04/17/2019) for f/u mac hole OS, DFE, OCT.  There are no Patient Instructions on file for this visit.   Explained the diagnoses, plan, and follow up with the patient and they expressed understanding.  Patient expressed understanding of the importance of proper follow up care.   This document serves as a record of services personally performed by Gardiner Sleeper, MD, PhD. It was created on their behalf by Leeann Must, [place], a certified ophthalmic assistant. The creation of this record is the provider's dictation and/or activities during the visit.    Electronically signed by: Leeann Must, COA _0 @ 2:31 AM   This document serves as a record of services personally performed by Gardiner Sleeper, MD, PhD. It was created on their behalf by Ernest Mallick, OA, an ophthalmic assistant. The creation of this record is the provider's dictation and/or activities during the visit.    Electronically signed by: Ernest Mallick, OA 01.21.2021 2:31 AM   Gardiner Sleeper, M.D., Ph.D. Diseases & Surgery of the Retina and Vitreous [facility]  I have reviewed the above documentation for accuracy and completeness, and I agree with the above. Gardiner Sleeper, M.D., Ph.D. 03/23/19 2:31 AM    Abbreviations: M myopia (nearsighted); A astigmatism; H hyperopia (farsighted); P presbyopia; Mrx spectacle prescription;  CTL contact lenses; OD right eye; OS left eye; OU both eyes  XT exotropia; ET esotropia; PEK punctate epithelial keratitis; PEE punctate epithelial erosions; DES dry eye syndrome; MGD meibomian gland dysfunction; ATs artificial tears; PFAT's preservative free artificial tears; [place] nuclear sclerotic cataract; PSC posterior subcapsular cataract; ERM epi-retinal membrane; PVD posterior vitreous detachment; RD retinal detachment; DM diabetes mellitus; DR diabetic retinopathy; NPDR non-proliferative  diabetic retinopathy; PDR proliferative diabetic retinopathy; CSME clinically significant macular edema; DME diabetic macular edema; dbh dot blot hemorrhages; CWS cotton wool spot; POAG primary open angle glaucoma; C/D cup-to-disc ratio; HVF humphrey visual field; GVF goldmann visual field; OCT optical coherence tomography; IOP intraocular pressure; BRVO Branch retinal vein occlusion; CRVO central retinal vein occlusion; CRAO central retinal artery occlusion; BRAO branch retinal artery occlusion; RT retinal tear; SB scleral buckle; PPV pars plana vitrectomy; VH Vitreous hemorrhage; PRP panretinal laser photocoagulation; IVK intravitreal kenalog; VMT vitreomacular traction; MH Macular hole;  NVD neovascularization of the disc; NVE neovascularization elsewhere; AREDS age related eye disease study; ARMD age related macular degeneration; POAG primary open angle glaucoma; EBMD epithelial/anterior basement membrane dystrophy; ACIOL anterior chamber intraocular lens; IOL intraocular lens; PCIOL posterior chamber intraocular lens; Phaco/IOL phacoemulsification with intraocular lens  placement; [place] photorefractive keratectomy; LASIK laser assisted in situ keratomileusis; HTN hypertension; DM diabetes mellitus; COPD chronic obstructive pulmonary disease

## 2019-03-20 ENCOUNTER — Ambulatory Visit (INDEPENDENT_AMBULATORY_CARE_PROVIDER_SITE_OTHER): Payer: Medicare PPO | Admitting: Ophthalmology

## 2019-03-20 ENCOUNTER — Encounter (INDEPENDENT_AMBULATORY_CARE_PROVIDER_SITE_OTHER): Payer: Self-pay | Admitting: Ophthalmology

## 2019-03-20 DIAGNOSIS — H3581 Retinal edema: Secondary | ICD-10-CM

## 2019-03-20 DIAGNOSIS — H26492 Other secondary cataract, left eye: Secondary | ICD-10-CM

## 2019-03-20 DIAGNOSIS — H35372 Puckering of macula, left eye: Secondary | ICD-10-CM

## 2019-03-20 DIAGNOSIS — D3132 Benign neoplasm of left choroid: Secondary | ICD-10-CM

## 2019-03-20 DIAGNOSIS — Z961 Presence of intraocular lens: Secondary | ICD-10-CM

## 2019-03-20 DIAGNOSIS — H35342 Macular cyst, hole, or pseudohole, left eye: Secondary | ICD-10-CM

## 2019-03-20 MED ORDER — PREDNISOLONE ACETATE 1 % OP SUSP: 1.0000 [drp] | Freq: Four times a day (QID) | OPHTHALMIC | 1 refills | Status: DC

## 2019-03-24 ENCOUNTER — Telehealth (INDEPENDENT_AMBULATORY_CARE_PROVIDER_SITE_OTHER): Payer: Self-pay

## 2019-04-17 ENCOUNTER — Encounter (INDEPENDENT_AMBULATORY_CARE_PROVIDER_SITE_OTHER): Payer: Medicare PPO | Admitting: Ophthalmology

## 2019-04-18 NOTE — Progress Notes (Signed)
[facility] Note  04/21/2019     CHIEF COMPLAINT Patient presents for Retina Follow Up   HISTORY OF PRESENT ILLNESS: Pamela Lang is a 69 y.o. female who presents to the clinic today for:   HPI    Retina Follow Up    Patient presents with  Other.  In left eye.  This started 4 weeks ago.  Severity is moderate.  I, the attending physician,  performed the HPI with the patient and updated documentation appropriately.          Comments    Patient here for 4 weeks retina follow up for mac hole OS. Patient states vision has cleared up since surgery. Small writing is wavy. Can't see price tags. No eye pain.        Last edited by Bernarda Caffey, MD on 04/21/2019  8:17 AM. (History)    pt states her gas bubble is gone, she is only using PF qid   Referring physician: [place], [place], [place], [place] [id number]   HISTORICAL INFORMATION:   Selected notes from the MEDICAL RECORD NUMBER Patient referred by Dr. Parke Simmers to evaluate full thickness macular hole OS     CURRENT MEDICATIONS: Current Outpatient Medications (Ophthalmic Drugs)  Medication Sig  . atropine 1 % ophthalmic solution Place 1 drop into the left eye 2 (two) times daily.   . bacitracin-polymyxin b (POLYSPORIN) ophthalmic ointment Place 1 application into the left eye 4 (four) times daily. apply to eye every 12 hours while awake  . brimonidine (ALPHAGAN) 0.2 % ophthalmic solution Place 1 drop into the left eye daily.  Marland Kitchen gatifloxacin (ZYMAXID) 0.5 % SOLN Place 1 drop into the left eye 4 (four) times daily.  . prednisoLONE acetate (PRED FORTE) 1 % ophthalmic suspension Place 1 drop into the left eye 4 (four) times daily.   No current facility-administered medications for this visit. (Ophthalmic Drugs)   Current Outpatient Medications (Other)  Medication Sig  . ALPRAZolam (XANAX) 0.25 MG tablet Take 0.25-0.5 mg by mouth every 6 (six) hours as needed for anxiety.   . famotidine (PEPCID) 20 MG tablet Take 20 mg by mouth as needed for heartburn or indigestion.   No current facility-administered medications for this visit. (Other)      REVIEW OF SYSTEMS: ROS    Positive for: Cardiovascular, Eyes   Negative for: Constitutional, Gastrointestinal, Neurological, Skin, Genitourinary, Musculoskeletal, HENT, Endocrine, Respiratory, Psychiatric, Allergic/Imm, Heme/Lymph   Last edited by Theodore Demark, COA on 04/21/2019  8:02 AM. (History)       ALLERGIES Allergies  Allergen Reactions  . Ceclor [Cefaclor] Nausea And Vomiting and Other (See Comments)    Severe stomach cramps  . Cortisone Other (See Comments)    TIA  . Propranolol Other (See Comments)    Decreased heart rate  . Aspirin Other (See Comments)    Irritates ulcers   . Augmentin [Amoxicillin-Pot Clavulanate] Nausea And Vomiting  . Ciprodex [Ciprofloxacin-Dexamethasone]     Migraines  . Codeine Nausea And Vomiting  . Darvocet [Propoxyphene N-Acetaminophen] Nausea And Vomiting  . Diazepam     Tachycardia   . Doxycycline Nausea And Vomiting  . Omnaris [Ciclesonide]     Migraines   . Other     ALL STEROIDS CAUSE TACHYCARDIA   . Sulfa Antibiotics Other (See Comments)    Migraine headache  . Sumatriptan Other (See Comments)    Tachycardia   . Zofran [Ondansetron Hcl]  Severe Migraines, Tachycardia   . Zoloft [Sertraline Hcl]     Tachycardia     PAST MEDICAL HISTORY Past Medical History:  Diagnosis Date  . Anxiety   . Eczema    ear  . GERD (gastroesophageal reflux disease)    diet controlled  . Headache    occasional - last one in 2019  . IBS (irritable bowel syndrome)   . MVP (mitral valve prolapse)    no problems  . Palpitations    EKG 12/24/18 and ECHO 01/22/19 normal in [facility]  - rx Diltiazem cd 120 mg qd but has not started until after 02/06/19 procedure with  Coralyn Pear  . PONV (postoperative nausea and vomiting)   . SVT (supraventricular tachycardia) ([place])   . TIA  (transient ischemic attack) 2012   TIA   Past Surgical History:  Procedure Laterality Date  . [place] VITRECTOMY WITH 20 GAUGE MVR PORT FOR MACULAR HOLE Left 02/06/2019   Procedure: 25 GAUGE PARS PLANA VITRECTOMY WITH 20 GAUGE MVR PORT FOR MACULAR HOLE;  Surgeon: Bernarda Caffey, MD;  Location: [facility];  Service: Ophthalmology;  Laterality: Left;  . BREAST CYST ASPIRATION Right   . CATARACT EXTRACTION Bilateral 2016   Dr. Herbert Deaner  . CESAREAN SECTION     x 2  . COLONOSCOPY    . Cyst B/L Wrist Bilateral   . GAS/FLUID EXCHANGE Left 02/06/2019   Procedure: Gas/Fluid Exchange;  Surgeon: Bernarda Caffey, MD;  Location: [facility];  Service: Ophthalmology;  Laterality: Left;  C3F8  . PHOTOCOAGULATION WITH LASER Left 02/06/2019   Procedure: Photocoagulation With Laser;  Surgeon: Bernarda Caffey, MD;  Location: [facility];  Service: Ophthalmology;  Laterality: Left;  . TOOTH EXTRACTION Bilateral    wisdom toothe ext  . TUBAL LIGATION      FAMILY HISTORY Family History  Problem Relation Age of Onset  . COPD Father   . Heart disease Father   . Diabetes Sister   . Diabetes Sister     SOCIAL HISTORY Social History   Tobacco Use  . Smoking status: Former Smoker    Types: Cigarettes  . Smokeless tobacco: Former Systems developer  . Tobacco comment: quit 30 yrs ago per patient 02/06/19  Substance Use Topics  . Alcohol use: No  . Drug use: No         OPHTHALMIC EXAM:  Base Eye Exam    Visual Acuity (Snellen - Linear)      Right Left   Dist [place] 20/20 20/40 -2   Dist ph [place]  20/40 +2  OS letters are wavy and has to look around to see letter.       Tonometry (Tonopen, 7:58 AM)      Right Left   Pressure 14 13       Pupils      Dark Light Shape React APD   Right 3 2 Round Brisk None   Left 5 4 Round Minimal None       Visual Fields (Counting fingers)      Left Right    Full Full       Extraocular Movement      Right Left    Full Full       Neuro/Psych    Oriented x3: Yes    Mood/Affect: Normal       Dilation    Both eyes: 1.0% Mydriacyl, 2.5% Phenylephrine @ 7:58 AM        Slit Lamp and Fundus Exam  Slit Lamp Exam      Right Left   Lids/Lashes Mild Meibomian gland dysfunction Mild Meibomian gland dysfunction, Dermatochalasis - upper lid   Conjunctiva/Sclera White and quiet White and quiet   Cornea Mild Arcus, trace Punctate epithelial erosions Trace Punctate epithelial erosions   Anterior Chamber Deep and quiet Deep and quiet   Iris Round and dilated Round and dilated   Lens PC IOL in good position PC IOL in good position with open PC   Vitreous Vitreous syneresis post vitrectomy, clear, gas bubble gone, VH clear       Fundus Exam      Right Left   Disc Pink and Sharp Pink and Sharp   C/D Ratio 0.5 0.5   Macula Flat, Blunted foveal reflex, trace Epiretinal membrane Flat, blunted foveal reflex, mac hole closed, mild Retinal pigment epithelial mottling, No heme or edema   Vessels Mild Vascular attenuation, mild Tortuousity, mild AV crossing changes Vascular attenuation, mild Tortuousity   Periphery Attached, no heme    Attached, pigmented choroidal lesion (nevus) at 0300 with +drusen, no SRF or orange pigment          IMAGING AND PROCEDURES  Imaging and Procedures for _0 @  OCT, Retina - OU - Both Eyes       Right Eye Quality was good. Central Foveal Thickness: 252. Progression has been stable. Findings include normal foveal contour, no IRF, no SRF, vitreomacular adhesion , epiretinal membrane.   Left Eye Quality was good. Central Foveal Thickness: 289. Progression has improved. Findings include no SRF, normal foveal contour, no IRF (Mac hole closed; mild interval improvement in vitreous opacities; mild focal disruption in ellipsoid).   Notes *Images captured and stored on drive  Diagnosis / Impression:  OD: NFP, no IRF/SRF OS: Mac hole closed; interval improvement/resolution in vitreous opacities; mild focal disruption in central  ellipsoid signal  Clinical management:  See below  Abbreviations: NFP - Normal foveal profile. CME - cystoid macular edema. PED - pigment epithelial detachment. IRF - intraretinal fluid. SRF - subretinal fluid. EZ - ellipsoid zone. ERM - epiretinal membrane. ORA - outer retinal atrophy. ORT - outer retinal tubulation. SRHM - subretinal hyper-reflective material                 ASSESSMENT/PLAN:    ICD-10-CM   1. Macular hole of left eye  H35.342   2. Epiretinal membrane (ERM) of left eye  H35.372   3. Retinal edema  H35.81 OCT, Retina - OU - Both Eyes  4. 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 Pseudophakia of both eyes  Z96.1   6. Left posterior capsular opacification  H26.492     1-3.  Macular Hole w/ ERM OS  - small full thickness macular hole  - +metamorphopsia, distortion centrally  - s/p PPV/TissueBlue stain/MP/14% C3F8 OS, 12.10.20  - mac hole closed             - retina attached and gas bubble gone  - mild preretinal heme overlying disc resolved  - mild inferior VH cleared             - IOP okay at 13             - start PF taper PF taper -- 3,2,1 drops daily, decrease every 2 weeks  - post op drops reviewed   - f/u 2-3 months, sooner prn  4. Choroidal Nevus OS  - relatively flat pigmented lesion  - located at  0300 periphery with +drusen, no SRF/orange pigment  - no suspicious features  - discussed findngs, prognosis  - baseline images done on 12.22.20  - monitor   5,6. Pseudophakia OU  - s/p CE/IOL OU (Dr. Herbert Deaner, 2016)  - s/p YAG capsulotomy OS (11.18.20)  - beautiful surgeries, doing well  - monitor   Ophthalmic Meds Ordered this visit:  No orders of the defined types were placed in this encounter.      Return for f/u 2-3 months, mac hole OS, DFE, OCT.  There are no Patient Instructions on file for this visit.   Explained the diagnoses, plan, and follow up with the patient and they expressed understanding.  Patient expressed understanding  of the importance of proper follow up care.   This document serves as a record of services personally performed by Gardiner Sleeper, MD, PhD. It was created on their behalf by Leeann Must, [place], a certified ophthalmic assistant. The creation of this record is the provider's dictation and/or activities during the visit.    Electronically signed by: Leeann Must, COA _0 @ 8:37 AM   This document serves as a record of services personally performed by Gardiner Sleeper, MD, PhD. It was created on their behalf by Ernest Mallick, OA, an ophthalmic assistant. The creation of this record is the provider's dictation and/or activities during the visit.    Electronically signed by: Ernest Mallick, OA 02.22.2021 8:37 AM   Gardiner Sleeper, M.D., Ph.D. Diseases & Surgery of the Retina and Vitreous [facility]  I have reviewed the above documentation for accuracy and completeness, and I agree with the above. Gardiner Sleeper, M.D., Ph.D. 04/21/19 8:37 AM   Abbreviations: M myopia (nearsighted); A astigmatism; H hyperopia (farsighted); P presbyopia; Mrx spectacle prescription;  CTL contact lenses; OD right eye; OS left eye; OU both eyes  XT exotropia; ET esotropia; PEK punctate epithelial keratitis; PEE punctate epithelial erosions; DES dry eye syndrome; MGD meibomian gland dysfunction; ATs artificial tears; PFAT's preservative free artificial tears; [place] nuclear sclerotic cataract; PSC posterior subcapsular cataract; ERM epi-retinal membrane; PVD posterior vitreous detachment; RD retinal detachment; DM diabetes mellitus; DR diabetic retinopathy; NPDR non-proliferative diabetic retinopathy; PDR proliferative diabetic retinopathy; CSME clinically significant macular edema; DME diabetic macular edema; dbh dot blot hemorrhages; CWS cotton wool spot; POAG primary open angle glaucoma; C/D cup-to-disc ratio; HVF humphrey visual field; GVF goldmann visual field; OCT optical coherence tomography; IOP  intraocular pressure; BRVO Branch retinal vein occlusion; CRVO central retinal vein occlusion; CRAO central retinal artery occlusion; BRAO branch retinal artery occlusion; RT retinal tear; SB scleral buckle; PPV pars plana vitrectomy; VH Vitreous hemorrhage; PRP panretinal laser photocoagulation; IVK intravitreal kenalog; VMT vitreomacular traction; MH Macular hole;  NVD neovascularization of the disc; NVE neovascularization elsewhere; AREDS age related eye disease study; ARMD age related macular degeneration; POAG primary open angle glaucoma; EBMD epithelial/anterior basement membrane dystrophy; ACIOL anterior chamber intraocular lens; IOL intraocular lens; PCIOL posterior chamber intraocular lens; Phaco/IOL phacoemulsification with intraocular lens placement; [place] photorefractive keratectomy; LASIK laser assisted in situ keratomileusis; HTN hypertension; DM diabetes mellitus; COPD chronic obstructive pulmonary disease

## 2019-04-21 ENCOUNTER — Ambulatory Visit (INDEPENDENT_AMBULATORY_CARE_PROVIDER_SITE_OTHER): Payer: Medicare PPO | Admitting: Ophthalmology

## 2019-04-21 ENCOUNTER — Encounter (INDEPENDENT_AMBULATORY_CARE_PROVIDER_SITE_OTHER): Payer: Self-pay | Admitting: Ophthalmology

## 2019-04-21 DIAGNOSIS — D3132 Benign neoplasm of left choroid: Secondary | ICD-10-CM

## 2019-04-21 DIAGNOSIS — Z961 Presence of intraocular lens: Secondary | ICD-10-CM

## 2019-04-21 DIAGNOSIS — H3581 Retinal edema: Secondary | ICD-10-CM | POA: Diagnosis not present

## 2019-04-21 DIAGNOSIS — H26492 Other secondary cataract, left eye: Secondary | ICD-10-CM

## 2019-04-21 DIAGNOSIS — H35342 Macular cyst, hole, or pseudohole, left eye: Secondary | ICD-10-CM

## 2019-04-21 DIAGNOSIS — H35372 Puckering of macula, left eye: Secondary | ICD-10-CM

## 2019-04-22 DIAGNOSIS — H5213 Myopia, bilateral: Secondary | ICD-10-CM | POA: Diagnosis not present

## 2019-04-22 DIAGNOSIS — H524 Presbyopia: Secondary | ICD-10-CM | POA: Diagnosis not present

## 2019-06-30 DIAGNOSIS — H6504 Acute serous otitis media, recurrent, right ear: Secondary | ICD-10-CM | POA: Diagnosis not present

## 2019-06-30 NOTE — Progress Notes (Signed)
[facility] Note  07/01/2019     CHIEF COMPLAINT Patient presents for Retina Follow Up   HISTORY OF PRESENT ILLNESS: Pamela Lang is a 70 y.o. female who presents to the clinic today for:   HPI    Retina Follow Up    Patient presents with  Other.  In left eye.  This started 2.5 months ago.  Severity is moderate.  I, the attending physician,  performed the HPI with the patient and updated documentation appropriately.          Comments    Patient here for 2.5 months retina follow up for mac hole OS. Patient states vision OS still bad. No eye pain.        Last edited by Bernarda Caffey, MD on 07/01/2019  9:20 AM. (History)    pt states she feels like her left eye vision is still blurry, she states if she covers her right eye, lines are still wavy, she has tried to get glasses, but hasn't been able to get a pair that works for her   Referring physician: Parke Simmers [place], [place], [place] [id number]   HISTORICAL INFORMATION:   Selected notes from the [place] Patient referred by Dr. Parke Simmers to evaluate full thickness macular hole OS     CURRENT MEDICATIONS: Current Outpatient Medications (Ophthalmic Drugs)  Medication Sig  . atropine 1 % ophthalmic solution Place 1 drop into the left eye 2 (two) times daily.   . bacitracin-polymyxin b (POLYSPORIN) ophthalmic ointment Place 1 application into the left eye 4 (four) times daily. apply to eye every 12 hours while awake  . brimonidine (ALPHAGAN) 0.2 % ophthalmic solution Place 1 drop into the left eye daily.  Marland Kitchen gatifloxacin (ZYMAXID) 0.5 % SOLN Place 1 drop into the left eye 4 (four) times daily.  . prednisoLONE acetate (PRED FORTE) 1 % ophthalmic suspension Place 1 drop into the left eye 4 (four) times daily.   No current facility-administered medications for this visit. (Ophthalmic Drugs)   Current Outpatient Medications (Other)  Medication Sig  . ALPRAZolam  (XANAX) 0.25 MG tablet Take 0.25-0.5 mg by mouth every 6 (six) hours as needed for anxiety.  . famotidine (PEPCID) 20 MG tablet Take 20 mg by mouth as needed for heartburn or indigestion.   No current facility-administered medications for this visit. (Other)      REVIEW OF SYSTEMS: ROS    Positive for: Cardiovascular, Eyes   Negative for: Constitutional, Gastrointestinal, Neurological, Skin, Genitourinary, Musculoskeletal, HENT, Endocrine, Respiratory, Psychiatric, Allergic/Imm, Heme/Lymph   Last edited by Theodore Demark, COA on 07/01/2019  8:55 AM. (History)       ALLERGIES Allergies  Allergen Reactions  . Ceclor [Cefaclor] Nausea And Vomiting and Other (See Comments)    Severe stomach cramps  . Cortisone Other (See Comments)    TIA  . Propranolol Other (See Comments)    Decreased heart rate  . Aspirin Other (See Comments)    Irritates ulcers   . Augmentin [Amoxicillin-Pot Clavulanate] Nausea And Vomiting  . Ciprodex [Ciprofloxacin-Dexamethasone]     Migraines  . Codeine Nausea And Vomiting  . Darvocet [Propoxyphene N-Acetaminophen] Nausea And Vomiting  . Diazepam     Tachycardia   . Doxycycline Nausea And Vomiting  . Omnaris [Ciclesonide]     Migraines   . Other     ALL STEROIDS CAUSE TACHYCARDIA   . Sulfa Antibiotics Other (See Comments)    Migraine  headache  . Sumatriptan Other (See Comments)    Tachycardia   . Zofran [Ondansetron Hcl]     Severe Migraines, Tachycardia   . Zoloft [Sertraline Hcl]     Tachycardia     PAST MEDICAL HISTORY Past Medical History:  Diagnosis Date  . Anxiety   . Eczema    ear  . GERD (gastroesophageal reflux disease)    diet controlled  . Headache    occasional - last one in 2019  . IBS (irritable bowel syndrome)   . MVP (mitral valve prolapse)    no problems  . Palpitations    EKG 12/24/18 and ECHO 01/22/19 normal in [facility]  - rx Diltiazem cd 120 mg qd but has not started until after 02/06/19 procedure with  Coralyn Pear  . PONV  (postoperative nausea and vomiting)   . SVT (supraventricular tachycardia) ([facility])   . TIA (transient ischemic attack) 2012   TIA   Past Surgical History:  Procedure Laterality Date  . [place] VITRECTOMY WITH 20 GAUGE MVR PORT FOR MACULAR HOLE Left 02/06/2019   Procedure: 25 GAUGE PARS PLANA VITRECTOMY WITH 20 GAUGE MVR PORT FOR MACULAR HOLE;  Surgeon: Bernarda Caffey, MD;  Location: [facility];  Service: Ophthalmology;  Laterality: Left;  . BREAST CYST ASPIRATION Right   . CATARACT EXTRACTION Bilateral 2016   Dr. Herbert Deaner  . CESAREAN SECTION     x 2  . COLONOSCOPY    . Cyst B/L Wrist Bilateral   . GAS/FLUID EXCHANGE Left 02/06/2019   Procedure: Gas/Fluid Exchange;  Surgeon: Bernarda Caffey, MD;  Location: [facility];  Service: Ophthalmology;  Laterality: Left;  C3F8  . PHOTOCOAGULATION WITH LASER Left 02/06/2019   Procedure: Photocoagulation With Laser;  Surgeon: Bernarda Caffey, MD;  Location: [facility];  Service: Ophthalmology;  Laterality: Left;  . TOOTH EXTRACTION Bilateral    wisdom toothe ext  . TUBAL LIGATION      FAMILY HISTORY Family History  Problem Relation Age of Onset  . COPD Father   . Heart disease Father   . Diabetes Sister   . Diabetes Sister     SOCIAL HISTORY Social History   Tobacco Use  . Smoking status: Former Smoker    Types: Cigarettes  . Smokeless tobacco: Former Systems developer  . Tobacco comment: quit 30 yrs ago per patient 02/06/19  Substance Use Topics  . Alcohol use: No  . Drug use: No         OPHTHALMIC EXAM:  Base Eye Exam    Visual Acuity (Snellen - Linear)      Right Left   Dist [place] 20/20 -1 20/40 -2   Dist ph [place]  20/30 -2  OS letters wavy and looks to the side to see letters.       Tonometry (Tonopen, 8:51 AM)      Right Left   Pressure 17 16       Pupils      Dark Light Shape React APD   Right 3 2 Round Brisk None   Left 5 4 Round Minimal None       Visual Fields (Counting fingers)      Left Right    Full Full       Extraocular  Movement      Right Left    Full Full       Neuro/Psych    Oriented x3: Yes   Mood/Affect: Normal       Dilation    Both eyes: 1.0%  Mydriacyl, 2.5% Phenylephrine @ 8:51 AM        Slit Lamp and Fundus Exam    Slit Lamp Exam      Right Left   Lids/Lashes Mild Meibomian gland dysfunction Mild Meibomian gland dysfunction, Dermatochalasis - upper lid   Conjunctiva/Sclera White and quiet White and quiet   Cornea Mild Arcus, trace Punctate epithelial erosions Trace Punctate epithelial erosions, mild Arcus   Anterior Chamber Deep and quiet Deep and quiet   Iris Round and dilated Round and dilated   Lens PC IOL in good position PC IOL in good position with open PC   Vitreous Vitreous syneresis post vitrectomy, clear       Fundus Exam      Right Left   Disc Pink and Sharp Pink and Sharp   C/D Ratio 0.5 0.5   Macula Flat, Blunted foveal reflex, trace Epiretinal membrane, No heme or edema Flat, blunted foveal reflex, mac hole closed, mild Retinal pigment epithelial mottling, No heme or edema   Vessels Mild Vascular attenuation, mild Tortuousity, mild AV crossing changes Vascular attenuation, mild Tortuousity   Periphery Attached, no heme    Attached, pigmented choroidal lesion (nevus) at 0300 with +drusen, no SRF or orange pigment          IMAGING AND PROCEDURES  Imaging and Procedures for _0 @  OCT, Retina - OU - Both Eyes       Right Eye Quality was good. Central Foveal Thickness: 262. Progression has been stable. Findings include normal foveal contour, no IRF, no SRF, vitreomacular adhesion , epiretinal membrane.   Left Eye Quality was good. Central Foveal Thickness: 289. Progression has improved. Findings include no SRF, normal foveal contour, no IRF (Mac hole closed; mild interval improvement in ellipsoid signal).   Notes *Images captured and stored on drive  Diagnosis / Impression:  OD: NFP, no IRF/SRF OS: Mac hole closed; mild interval improvement in ellipsoid  signal  Clinical management:  See below  Abbreviations: NFP - Normal foveal profile. CME - cystoid macular edema. PED - pigment epithelial detachment. IRF - intraretinal fluid. SRF - subretinal fluid. EZ - ellipsoid zone. ERM - epiretinal membrane. ORA - outer retinal atrophy. ORT - outer retinal tubulation. SRHM - subretinal hyper-reflective material                 ASSESSMENT/PLAN:    ICD-10-CM   1. Macular hole of left eye  H35.342   2. Epiretinal membrane (ERM) of left eye  H35.372   3. Retinal edema  H35.81 OCT, Retina - OU - Both Eyes  4. 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 Pseudophakia of both eyes  Z96.1   6. Left posterior capsular opacification  H26.492     1-3.  Macular Hole w/ ERM OS  - small full thickness macular hole  - +metamorphopsia, distortion centrally  - s/p PPV/TissueBlue stain/MP/14% C3F8 OS, 12.10.20  - mac hole closed and ellipsoid signal improving on OCT             - retina attached              - IOP okay at 16  - f/u 4-6 months, sooner prn  4. Choroidal Nevus OS -- stable  - relatively flat pigmented lesion  - located at 0300 periphery with +drusen, no SRF/orange pigment  - no suspicious features  - discussed findngs, prognosis  - baseline images done on 12.22.20  - monitor   5,6. Pseudophakia  OU  - s/p CE/IOL OU (Dr. Herbert Deaner, 2016)  - s/p YAG capsulotomy OS (11.18.20)  - beautiful surgeries, doing well  - monitor   Ophthalmic Meds Ordered this visit:  No orders of the defined types were placed in this encounter.      Return for f/u 4-6 months, mac hole OS, DFE, OCT.  There are no Patient Instructions on file for this visit.   Explained the diagnoses, plan, and follow up with the patient and they expressed understanding.  Patient expressed understanding of the importance of proper follow up care.   This document serves as a record of services personally performed by Gardiner Sleeper, MD, PhD. It was created on their  behalf by Estill Bakes, COT an ophthalmic technician. The creation of this record is the provider's dictation and/or activities during the visit.    Electronically signed by: Estill Bakes, COT 06/30/19 @ 10:50 AM   This document serves as a record of services personally performed by Gardiner Sleeper, MD, PhD. It was created on their behalf by Ernest Mallick, OA, an ophthalmic assistant. The creation of this record is the provider's dictation and/or activities during the visit.    Electronically signed by: Ernest Mallick, OA 05.04.2021 10:50 AM  Gardiner Sleeper, M.D., Ph.D. Diseases & Surgery of the Retina and [place] 07/01/2019   I have reviewed the above documentation for accuracy and completeness, and I agree with the above. Gardiner Sleeper, M.D., Ph.D. 07/01/19 10:50 AM   Abbreviations: M myopia (nearsighted); A astigmatism; H hyperopia (farsighted); P presbyopia; Mrx spectacle prescription;  CTL contact lenses; OD right eye; OS left eye; OU both eyes  XT exotropia; ET esotropia; PEK punctate epithelial keratitis; PEE punctate epithelial erosions; DES dry eye syndrome; MGD meibomian gland dysfunction; ATs artificial tears; PFAT's preservative free artificial tears; [place] nuclear sclerotic cataract; PSC posterior subcapsular cataract; ERM epi-retinal membrane; PVD posterior vitreous detachment; RD retinal detachment; DM diabetes mellitus; DR diabetic retinopathy; NPDR non-proliferative diabetic retinopathy; PDR proliferative diabetic retinopathy; CSME clinically significant macular edema; DME diabetic macular edema; dbh dot blot hemorrhages; CWS cotton wool spot; POAG primary open angle glaucoma; C/D cup-to-disc ratio; HVF humphrey visual field; GVF goldmann visual field; OCT optical coherence tomography; IOP intraocular pressure; BRVO Branch retinal vein occlusion; CRVO central retinal vein occlusion; CRAO central retinal artery occlusion; BRAO branch retinal artery  occlusion; RT retinal tear; SB scleral buckle; PPV pars plana vitrectomy; VH Vitreous hemorrhage; PRP panretinal laser photocoagulation; IVK intravitreal kenalog; VMT vitreomacular traction; MH Macular hole;  NVD neovascularization of the disc; NVE neovascularization elsewhere; AREDS age related eye disease study; ARMD age related macular degeneration; POAG primary open angle glaucoma; EBMD epithelial/anterior basement membrane dystrophy; ACIOL anterior chamber intraocular lens; IOL intraocular lens; PCIOL posterior chamber intraocular lens; Phaco/IOL phacoemulsification with intraocular lens placement; [place] photorefractive keratectomy; LASIK laser assisted in situ keratomileusis; HTN hypertension; DM diabetes mellitus; COPD chronic obstructive pulmonary disease

## 2019-07-01 ENCOUNTER — Encounter (INDEPENDENT_AMBULATORY_CARE_PROVIDER_SITE_OTHER): Payer: Self-pay | Admitting: Ophthalmology

## 2019-07-01 ENCOUNTER — Ambulatory Visit (INDEPENDENT_AMBULATORY_CARE_PROVIDER_SITE_OTHER): Payer: Medicare PPO | Admitting: Ophthalmology

## 2019-07-01 ENCOUNTER — Other Ambulatory Visit: Payer: Self-pay

## 2019-07-01 DIAGNOSIS — H3581 Retinal edema: Secondary | ICD-10-CM

## 2019-07-01 DIAGNOSIS — D3132 Benign neoplasm of left choroid: Secondary | ICD-10-CM

## 2019-07-01 DIAGNOSIS — H26492 Other secondary cataract, left eye: Secondary | ICD-10-CM | POA: Diagnosis not present

## 2019-07-01 DIAGNOSIS — H35372 Puckering of macula, left eye: Secondary | ICD-10-CM

## 2019-07-01 DIAGNOSIS — Z961 Presence of intraocular lens: Secondary | ICD-10-CM

## 2019-07-01 DIAGNOSIS — H35342 Macular cyst, hole, or pseudohole, left eye: Secondary | ICD-10-CM | POA: Diagnosis not present

## 2019-07-21 DIAGNOSIS — H60541 Acute eczematoid otitis externa, right ear: Secondary | ICD-10-CM | POA: Diagnosis not present

## 2019-07-21 DIAGNOSIS — J343 Hypertrophy of nasal turbinates: Secondary | ICD-10-CM | POA: Diagnosis not present

## 2019-07-21 DIAGNOSIS — H6121 Impacted cerumen, right ear: Secondary | ICD-10-CM | POA: Diagnosis not present

## 2019-07-24 DIAGNOSIS — M7582 Other shoulder lesions, left shoulder: Secondary | ICD-10-CM | POA: Diagnosis not present

## 2019-08-01 DIAGNOSIS — J069 Acute upper respiratory infection, unspecified: Secondary | ICD-10-CM | POA: Diagnosis not present

## 2019-08-14 DIAGNOSIS — Z6827 Body mass index (BMI) 27.0-27.9, adult: Secondary | ICD-10-CM | POA: Diagnosis not present

## 2019-08-14 DIAGNOSIS — J209 Acute bronchitis, unspecified: Secondary | ICD-10-CM | POA: Diagnosis not present

## 2019-08-14 DIAGNOSIS — E663 Overweight: Secondary | ICD-10-CM | POA: Diagnosis not present

## 2019-08-20 DIAGNOSIS — Z20828 Contact with and (suspected) exposure to other viral communicable diseases: Secondary | ICD-10-CM | POA: Diagnosis not present

## 2019-08-29 DIAGNOSIS — I471 Supraventricular tachycardia: Secondary | ICD-10-CM | POA: Diagnosis not present

## 2019-08-29 DIAGNOSIS — Z8679 Personal history of other diseases of the circulatory system: Secondary | ICD-10-CM | POA: Diagnosis not present

## 2019-08-29 DIAGNOSIS — I4719 Other supraventricular tachycardia: Secondary | ICD-10-CM | POA: Insufficient documentation

## 2019-08-29 DIAGNOSIS — Z8673 Personal history of transient ischemic attack (TIA), and cerebral infarction without residual deficits: Secondary | ICD-10-CM | POA: Diagnosis not present

## 2019-08-29 DIAGNOSIS — R002 Palpitations: Secondary | ICD-10-CM | POA: Diagnosis not present

## 2019-09-29 ENCOUNTER — Other Ambulatory Visit: Payer: Self-pay | Admitting: Family Medicine

## 2019-09-29 DIAGNOSIS — Z1231 Encounter for screening mammogram for malignant neoplasm of breast: Secondary | ICD-10-CM

## 2019-10-01 ENCOUNTER — Ambulatory Visit
Admission: RE | Admit: 2019-10-01 | Discharge: 2019-10-01 | Disposition: A | Discharge status: Home / Self Care | Payer: Medicare PPO | Source: Ambulatory Visit | Attending: Family Medicine | Admitting: Family Medicine

## 2019-10-01 ENCOUNTER — Other Ambulatory Visit: Payer: Self-pay

## 2019-10-01 DIAGNOSIS — R002 Palpitations: Secondary | ICD-10-CM | POA: Diagnosis not present

## 2019-10-01 DIAGNOSIS — Z1231 Encounter for screening mammogram for malignant neoplasm of breast: Secondary | ICD-10-CM | POA: Diagnosis not present

## 2019-10-05 DIAGNOSIS — I471 Supraventricular tachycardia: Secondary | ICD-10-CM | POA: Diagnosis not present

## 2019-10-16 DIAGNOSIS — F419 Anxiety disorder, unspecified: Secondary | ICD-10-CM | POA: Diagnosis not present

## 2019-10-16 DIAGNOSIS — Z0000 Encounter for general adult medical examination without abnormal findings: Secondary | ICD-10-CM | POA: Diagnosis not present

## 2019-10-16 DIAGNOSIS — I471 Supraventricular tachycardia: Secondary | ICD-10-CM | POA: Diagnosis not present

## 2019-10-16 DIAGNOSIS — Z6827 Body mass index (BMI) 27.0-27.9, adult: Secondary | ICD-10-CM | POA: Diagnosis not present

## 2019-10-16 DIAGNOSIS — Z1389 Encounter for screening for other disorder: Secondary | ICD-10-CM | POA: Diagnosis not present

## 2019-11-18 DIAGNOSIS — Z8679 Personal history of other diseases of the circulatory system: Secondary | ICD-10-CM | POA: Diagnosis not present

## 2019-11-18 DIAGNOSIS — I471 Supraventricular tachycardia: Secondary | ICD-10-CM | POA: Diagnosis not present

## 2019-11-18 DIAGNOSIS — Z8673 Personal history of transient ischemic attack (TIA), and cerebral infarction without residual deficits: Secondary | ICD-10-CM | POA: Diagnosis not present

## 2019-11-18 DIAGNOSIS — H60541 Acute eczematoid otitis externa, right ear: Secondary | ICD-10-CM | POA: Diagnosis not present

## 2019-11-18 DIAGNOSIS — R002 Palpitations: Secondary | ICD-10-CM | POA: Diagnosis not present

## 2019-12-06 DIAGNOSIS — R1011 Right upper quadrant pain: Secondary | ICD-10-CM | POA: Diagnosis not present

## 2019-12-06 DIAGNOSIS — G8929 Other chronic pain: Secondary | ICD-10-CM | POA: Diagnosis not present

## 2019-12-06 DIAGNOSIS — K589 Irritable bowel syndrome without diarrhea: Secondary | ICD-10-CM | POA: Diagnosis not present

## 2019-12-06 DIAGNOSIS — I700 Atherosclerosis of aorta: Secondary | ICD-10-CM | POA: Diagnosis not present

## 2019-12-06 DIAGNOSIS — R109 Unspecified abdominal pain: Secondary | ICD-10-CM | POA: Diagnosis not present

## 2019-12-06 DIAGNOSIS — D1771 Benign lipomatous neoplasm of kidney: Secondary | ICD-10-CM | POA: Diagnosis not present

## 2019-12-06 DIAGNOSIS — R110 Nausea: Secondary | ICD-10-CM | POA: Diagnosis not present

## 2019-12-07 DIAGNOSIS — R109 Unspecified abdominal pain: Secondary | ICD-10-CM | POA: Diagnosis not present

## 2019-12-10 DIAGNOSIS — K5900 Constipation, unspecified: Secondary | ICD-10-CM | POA: Diagnosis not present

## 2019-12-10 DIAGNOSIS — R1030 Lower abdominal pain, unspecified: Secondary | ICD-10-CM | POA: Diagnosis not present

## 2019-12-10 DIAGNOSIS — R1013 Epigastric pain: Secondary | ICD-10-CM | POA: Diagnosis not present

## 2019-12-11 DIAGNOSIS — K5909 Other constipation: Secondary | ICD-10-CM | POA: Diagnosis not present

## 2019-12-11 DIAGNOSIS — F419 Anxiety disorder, unspecified: Secondary | ICD-10-CM | POA: Diagnosis not present

## 2019-12-11 DIAGNOSIS — E663 Overweight: Secondary | ICD-10-CM | POA: Diagnosis not present

## 2019-12-11 DIAGNOSIS — Z6827 Body mass index (BMI) 27.0-27.9, adult: Secondary | ICD-10-CM | POA: Diagnosis not present

## 2019-12-13 DIAGNOSIS — R1030 Lower abdominal pain, unspecified: Secondary | ICD-10-CM | POA: Insufficient documentation

## 2019-12-23 DIAGNOSIS — I471 Supraventricular tachycardia: Secondary | ICD-10-CM | POA: Diagnosis not present

## 2019-12-24 DIAGNOSIS — R634 Abnormal weight loss: Secondary | ICD-10-CM | POA: Diagnosis not present

## 2019-12-24 DIAGNOSIS — R630 Anorexia: Secondary | ICD-10-CM | POA: Diagnosis not present

## 2019-12-24 DIAGNOSIS — R1013 Epigastric pain: Secondary | ICD-10-CM | POA: Diagnosis not present

## 2019-12-24 DIAGNOSIS — K5900 Constipation, unspecified: Secondary | ICD-10-CM | POA: Diagnosis not present

## 2019-12-25 DIAGNOSIS — R1013 Epigastric pain: Secondary | ICD-10-CM | POA: Diagnosis not present

## 2019-12-25 DIAGNOSIS — K2289 Other specified disease of esophagus: Secondary | ICD-10-CM | POA: Diagnosis not present

## 2019-12-25 DIAGNOSIS — K317 Polyp of stomach and duodenum: Secondary | ICD-10-CM | POA: Diagnosis not present

## 2019-12-25 DIAGNOSIS — K648 Other hemorrhoids: Secondary | ICD-10-CM | POA: Diagnosis not present

## 2019-12-25 DIAGNOSIS — R634 Abnormal weight loss: Secondary | ICD-10-CM | POA: Diagnosis not present

## 2019-12-25 DIAGNOSIS — K5900 Constipation, unspecified: Secondary | ICD-10-CM | POA: Diagnosis not present

## 2019-12-25 DIAGNOSIS — R630 Anorexia: Secondary | ICD-10-CM | POA: Diagnosis not present

## 2019-12-25 DIAGNOSIS — K219 Gastro-esophageal reflux disease without esophagitis: Secondary | ICD-10-CM | POA: Diagnosis not present

## 2019-12-25 DIAGNOSIS — K2930 Chronic superficial gastritis without bleeding: Secondary | ICD-10-CM | POA: Diagnosis not present

## 2019-12-25 DIAGNOSIS — K635 Polyp of colon: Secondary | ICD-10-CM | POA: Diagnosis not present

## 2019-12-25 DIAGNOSIS — R194 Change in bowel habit: Secondary | ICD-10-CM | POA: Diagnosis not present

## 2019-12-25 DIAGNOSIS — D126 Benign neoplasm of colon, unspecified: Secondary | ICD-10-CM | POA: Diagnosis not present

## 2019-12-26 DIAGNOSIS — R1013 Epigastric pain: Secondary | ICD-10-CM | POA: Diagnosis not present

## 2019-12-29 DIAGNOSIS — K838 Other specified diseases of biliary tract: Secondary | ICD-10-CM | POA: Diagnosis not present

## 2019-12-29 DIAGNOSIS — R1013 Epigastric pain: Secondary | ICD-10-CM | POA: Diagnosis not present

## 2020-01-01 NOTE — Progress Notes (Addendum)
[facility] Note  01/05/2020     CHIEF COMPLAINT Patient presents for Retina Follow Up   HISTORY OF PRESENT ILLNESS: Pamela Lang is a 69 y.o. female who presents to the clinic today for:   HPI    Retina Follow Up    Patient presents with  Other.  In left eye.  This started months ago.  Severity is moderate.  Duration of months.  I, the attending physician,  performed the HPI with the patient and updated documentation appropriately.          Comments    Pt states her vision seems the same.  Pt states she has been through 6 pairs of glasses and cannot find what works best for her for reading or driving at night.  Patient denies eye pain or discomfort and denies any new or worsening floaters or fol OU.       Last edited by Bernarda Caffey, MD on 01/08/2020 10:13 PM. (History)    pt states   Referring physician: [place], [place], [address] [place], [place] [address]   HISTORICAL INFORMATION:   Selected notes from the MEDICAL RECORD NUMBER Patient referred by Dr. Parke Simmers to evaluate full thickness macular hole OS     CURRENT MEDICATIONS: Current Outpatient Medications (Ophthalmic Drugs)  Medication Sig   atropine 1 % ophthalmic solution Place 1 drop into the left eye 2 (two) times daily.  (Patient not taking: Reported on 01/05/2020)   bacitracin-polymyxin b (POLYSPORIN) ophthalmic ointment Place 1 application into the left eye 4 (four) times daily. apply to eye every 12 hours while awake (Patient not taking: Reported on 01/05/2020)   brimonidine (ALPHAGAN) 0.2 % ophthalmic solution Place 1 drop into the left eye daily. (Patient not taking: Reported on 01/05/2020)   gatifloxacin (ZYMAXID) 0.5 % SOLN Place 1 drop into the left eye 4 (four) times daily. (Patient not taking: Reported on 01/05/2020)   prednisoLONE acetate (PRED FORTE) 1 % ophthalmic suspension Place 1 drop into the left eye 4 (four) times daily. (Patient not  taking: Reported on 01/05/2020)   No current facility-administered medications for this visit. (Ophthalmic Drugs)   Current Outpatient Medications (Other)  Medication Sig   ALPRAZolam (XANAX) 0.25 MG tablet Take 0.25-0.5 mg by mouth every 6 (six) hours as needed for anxiety.   BREO ELLIPTA 200-25 MCG/INH AEPB    diltiazem (CARDIZEM) 30 MG tablet    famotidine (PEPCID) 20 MG tablet Take 20 mg by mouth as needed for heartburn or indigestion.   omeprazole (PRILOSEC) 20 MG capsule    ondansetron (ZOFRAN-ODT) 4 MG disintegrating tablet    sertraline (ZOLOFT) 25 MG tablet    No current facility-administered medications for this visit. (Other)      REVIEW OF SYSTEMS: ROS    Positive for: Cardiovascular, Eyes   Negative for: Constitutional, Gastrointestinal, Neurological, Skin, Genitourinary, Musculoskeletal, HENT, Endocrine, Respiratory, Psychiatric, Allergic/Imm, Heme/Lymph   Last edited by Doneen Poisson on 01/05/2020  8:39 AM. (History)       ALLERGIES Allergies  Allergen Reactions   Ceclor [Cefaclor] Nausea And Vomiting and Other (See Comments)    Severe stomach cramps   Cortisone Other (See Comments)    TIA   Propranolol Other (See Comments)    Decreased heart rate   Aspirin Other (See Comments)    Irritates ulcers    Augmentin [Amoxicillin-Pot Clavulanate] Nausea And Vomiting   Ciprodex [Ciprofloxacin-Dexamethasone]     Migraines   Codeine  Nausea And Vomiting   Darvocet [Propoxyphene N-Acetaminophen] Nausea And Vomiting   Diazepam     Tachycardia    Doxycycline Nausea And Vomiting   Omnaris [Ciclesonide]     Migraines    Other     ALL STEROIDS CAUSE TACHYCARDIA    Sulfa Antibiotics Other (See Comments)    Migraine headache   Sumatriptan Other (See Comments)    Tachycardia    Zofran [Ondansetron Hcl]     Severe Migraines, Tachycardia    Zoloft [Sertraline Hcl]     Tachycardia     PAST MEDICAL HISTORY Past Medical History:   Diagnosis Date   Anxiety    Eczema    ear   GERD (gastroesophageal reflux disease)    diet controlled   Headache    occasional - last one in 2019   IBS (irritable bowel syndrome)    MVP (mitral valve prolapse)    no problems   Palpitations    EKG 12/24/18 and ECHO 01/22/19 normal in [facility]  - rx Diltiazem cd 120 mg qd but has not started until after 02/06/19 procedure with  Coralyn Pear   PONV (postoperative nausea and vomiting)    SVT (supraventricular tachycardia) ([place])    TIA (transient ischemic attack) 2012   TIA   Past Surgical History:  Procedure Laterality Date   25 GAUGE PARS PLANA VITRECTOMY WITH 20 GAUGE MVR PORT FOR MACULAR HOLE Left 02/06/2019   Procedure: 25 GAUGE PARS PLANA VITRECTOMY WITH 20 GAUGE MVR PORT FOR MACULAR HOLE;  Surgeon: Bernarda Caffey, MD;  Location: [facility];  Service: Ophthalmology;  Laterality: Left;   BREAST CYST ASPIRATION Right    CATARACT EXTRACTION Bilateral 2016   Dr. Herbert Deaner   CESAREAN SECTION     x 2   COLONOSCOPY     Cyst B/L Wrist Bilateral    GAS/FLUID EXCHANGE Left 02/06/2019   Procedure: Gas/Fluid Exchange;  Surgeon: Bernarda Caffey, MD;  Location: [facility];  Service: Ophthalmology;  Laterality: Left;  C3F8   PHOTOCOAGULATION WITH LASER Left 02/06/2019   Procedure: Photocoagulation With Laser;  Surgeon: Bernarda Caffey, MD;  Location: [facility];  Service: Ophthalmology;  Laterality: Left;   TOOTH EXTRACTION Bilateral    wisdom toothe ext   TUBAL LIGATION      FAMILY HISTORY Family History  Problem Relation Age of Onset   COPD Father    Heart disease Father    Diabetes Sister    Diabetes Sister     SOCIAL HISTORY Social History   Tobacco Use   Smoking status: Former Smoker    Types: Cigarettes   Smokeless tobacco: Former Systems developer   Tobacco comment: quit 30 yrs ago per patient 02/06/19  Vaping Use   Vaping Use: Never used  Substance Use Topics   Alcohol use: No   Drug use: No         OPHTHALMIC EXAM:  Base  Eye Exam    Visual Acuity (Snellen - Linear)      Right Left   Dist [place] 20/30 +1 20/50 -2   Dist ph [place] 20/20 20/30 -2       Tonometry (Tonopen, 8:46 AM)      Right Left   Pressure 12 13       Pupils      Dark Light Shape React APD   Right 3 2 Round Brisk 0   Left            Visual Fields      Left Right  Full Full       Extraocular Movement      Right Left    Full Full       Neuro/Psych    Oriented x3: Yes   Mood/Affect: Normal       Dilation    Both eyes: 1.0% Mydriacyl, 2.5% Phenylephrine @ 8:46 AM        Slit Lamp and Fundus Exam    Slit Lamp Exam      Right Left   Lids/Lashes Mild Meibomian gland dysfunction Mild Meibomian gland dysfunction, Dermatochalasis - upper lid   Conjunctiva/Sclera White and quiet White and quiet   Cornea Mild Arcus, trace Punctate epithelial erosions Trace Punctate epithelial erosions, mild Arcus, trace, fine endo pigment   Anterior Chamber Deep and quiet Deep and quiet   Iris Round and dilated Round and dilated   Lens PC IOL in good position PC IOL in good position with open PC   Vitreous Vitreous syneresis post vitrectomy, clear       Fundus Exam      Right Left   Disc Pink and Sharp Pink and Sharp   C/D Ratio 0.5 0.5   Macula Flat, Blunted foveal reflex, trace Epiretinal membrane, No heme or edema Flat, blunted foveal reflex, mac hole closed, mild Retinal pigment epithelial mottling, No heme or edema   Vessels Mild Vascular attenuation, mild Tortuousity, mild AV crossing changes Vascular attenuation, mild Tortuousity   Periphery Attached, no heme    Attached, pigmented choroidal lesion (nevus) at 0300 with +drusen, no SRF or orange pigment          IMAGING AND PROCEDURES  Imaging and Procedures for _0 @  OCT, Retina - OU - Both Eyes       Right Eye Quality was good. Central Foveal Thickness: 263. Progression has been stable. Findings include normal foveal contour, no IRF, no SRF, vitreomacular adhesion ,  epiretinal membrane.   Left Eye Quality was good. Central Foveal Thickness: 290. Progression has been stable. Findings include no SRF, normal foveal contour, no IRF (Mac hole closed; mild interval improvement in ellipsoid signal, mild progression of inner retinal irregularity temporal macula).   Notes *Images captured and stored on drive  Diagnosis / Impression:  OD: NFP, no IRF/SRF OS: Mac hole closed; mild interval improvement in ellipsoid signal, mild progression of inner retinal irregularity temporal macula  Clinical management:  See below  Abbreviations: NFP - Normal foveal profile. CME - cystoid macular edema. PED - pigment epithelial detachment. IRF - intraretinal fluid. SRF - subretinal fluid. EZ - ellipsoid zone. ERM - epiretinal membrane. ORA - outer retinal atrophy. ORT - outer retinal tubulation. SRHM - subretinal hyper-reflective material                 ASSESSMENT/PLAN:    ICD-10-CM   1. Macular hole of left eye  H35.342   2. Epiretinal membrane (ERM) of left eye  H35.372   3. Retinal edema  H35.81 OCT, Retina - OU - Both Eyes  4. 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 Pseudophakia of both eyes  Z96.1   6. Left posterior capsular opacification  H26.492     1-3.  Macular Hole w/ ERM OS  - small full thickness macular hole  - +metamorphopsia, distortion centrally  - s/p PPV/TissueBlue stain/MP/14% C3F8 OS, 12.10.20  - OCT shows mac hole closed; mild interval improvement in ellipsoid signal, mild progression of inner retinal irregularity temporal macula             -  retina attached              - IOP okay at 13  - f/u 9-12 months, sooner prn  4. Choroidal Nevus OS -- stable  - relatively flat pigmented lesion  - located at 0300 periphery with +drusen, no SRF/orange pigment  - no suspicious features  - discussed findngs, prognosis  - baseline images done on 12.22.20  - monitor   5,6. Pseudophakia OU  - s/p CE/IOL OU (Dr. Herbert Deaner, 2016)  - s/p YAG  capsulotomy OS (11.18.20)  - beautiful surgeries, doing well  - monitor   Ophthalmic Meds Ordered this visit:  No orders of the defined types were placed in this encounter.      Return for f/u 9-12 months, FTMH OS, DFE, OCT.  There are no Patient Instructions on file for this visit.  This document serves as a record of services personally performed by Gardiner Sleeper, MD, PhD. It was created on their behalf by Leeann Must, [place], an ophthalmic technician. The creation of this record is the provider's dictation and/or activities during the visit.    Electronically signed by: Leeann Must, COA 11.04.2021 10:16 PM   This document serves as a record of services personally performed by Gardiner Sleeper, MD, PhD. It was created on their behalf by San Jetty. Owens Shark, OA an ophthalmic technician. The creation of this record is the provider's dictation and/or activities during the visit.    Electronically signed by: San Jetty. Marguerita Merles 11.08.2021 10:16 PM  Gardiner Sleeper, M.D., Ph.D. Diseases & Surgery of the Retina and [place] 01/05/2020   I have reviewed the above documentation for accuracy and completeness, and I agree with the above. Gardiner Sleeper, M.D., Ph.D. 01/08/20 10:16 PM   Abbreviations: M myopia (nearsighted); A astigmatism; H hyperopia (farsighted); P presbyopia; Mrx spectacle prescription;  CTL contact lenses; OD right eye; OS left eye; OU both eyes  XT exotropia; ET esotropia; PEK punctate epithelial keratitis; PEE punctate epithelial erosions; DES dry eye syndrome; MGD meibomian gland dysfunction; ATs artificial tears; PFAT's preservative free artificial tears; [place] nuclear sclerotic cataract; PSC posterior subcapsular cataract; ERM epi-retinal membrane; PVD posterior vitreous detachment; RD retinal detachment; DM diabetes mellitus; DR diabetic retinopathy; NPDR non-proliferative diabetic retinopathy; PDR proliferative diabetic retinopathy;  CSME clinically significant macular edema; DME diabetic macular edema; dbh dot blot hemorrhages; CWS cotton wool spot; POAG primary open angle glaucoma; C/D cup-to-disc ratio; HVF humphrey visual field; GVF goldmann visual field; OCT optical coherence tomography; IOP intraocular pressure; BRVO Branch retinal vein occlusion; CRVO central retinal vein occlusion; CRAO central retinal artery occlusion; BRAO branch retinal artery occlusion; RT retinal tear; SB scleral buckle; PPV pars plana vitrectomy; VH Vitreous hemorrhage; PRP panretinal laser photocoagulation; IVK intravitreal kenalog; VMT vitreomacular traction; MH Macular hole;  NVD neovascularization of the disc; NVE neovascularization elsewhere; AREDS age related eye disease study; ARMD age related macular degeneration; POAG primary open angle glaucoma; EBMD epithelial/anterior basement membrane dystrophy; ACIOL anterior chamber intraocular lens; IOL intraocular lens; PCIOL posterior chamber intraocular lens; Phaco/IOL phacoemulsification with intraocular lens placement; [place] photorefractive keratectomy; LASIK laser assisted in situ keratomileusis; HTN hypertension; DM diabetes mellitus; COPD chronic obstructive pulmonary disease

## 2020-01-05 ENCOUNTER — Ambulatory Visit (INDEPENDENT_AMBULATORY_CARE_PROVIDER_SITE_OTHER): Payer: Medicare PPO | Admitting: Ophthalmology

## 2020-01-05 ENCOUNTER — Other Ambulatory Visit: Payer: Self-pay

## 2020-01-05 DIAGNOSIS — H3581 Retinal edema: Secondary | ICD-10-CM | POA: Diagnosis not present

## 2020-01-05 DIAGNOSIS — H35342 Macular cyst, hole, or pseudohole, left eye: Secondary | ICD-10-CM

## 2020-01-05 DIAGNOSIS — Z961 Presence of intraocular lens: Secondary | ICD-10-CM

## 2020-01-05 DIAGNOSIS — H35372 Puckering of macula, left eye: Secondary | ICD-10-CM

## 2020-01-05 DIAGNOSIS — H26492 Other secondary cataract, left eye: Secondary | ICD-10-CM

## 2020-01-05 DIAGNOSIS — D3132 Benign neoplasm of left choroid: Secondary | ICD-10-CM | POA: Diagnosis not present

## 2020-01-08 ENCOUNTER — Encounter (INDEPENDENT_AMBULATORY_CARE_PROVIDER_SITE_OTHER): Payer: Self-pay | Admitting: Ophthalmology

## 2020-01-14 DIAGNOSIS — R140 Abdominal distension (gaseous): Secondary | ICD-10-CM | POA: Diagnosis not present

## 2020-01-14 DIAGNOSIS — K5900 Constipation, unspecified: Secondary | ICD-10-CM | POA: Diagnosis not present

## 2020-01-14 DIAGNOSIS — R1013 Epigastric pain: Secondary | ICD-10-CM | POA: Diagnosis not present

## 2020-02-17 DIAGNOSIS — L03213 Periorbital cellulitis: Secondary | ICD-10-CM | POA: Diagnosis not present

## 2020-02-17 DIAGNOSIS — E559 Vitamin D deficiency, unspecified: Secondary | ICD-10-CM | POA: Diagnosis not present

## 2020-02-17 DIAGNOSIS — D649 Anemia, unspecified: Secondary | ICD-10-CM | POA: Diagnosis not present

## 2020-02-17 DIAGNOSIS — Z6827 Body mass index (BMI) 27.0-27.9, adult: Secondary | ICD-10-CM | POA: Diagnosis not present

## 2020-02-17 DIAGNOSIS — G629 Polyneuropathy, unspecified: Secondary | ICD-10-CM | POA: Diagnosis not present

## 2020-02-17 DIAGNOSIS — H01003 Unspecified blepharitis right eye, unspecified eyelid: Secondary | ICD-10-CM | POA: Diagnosis not present

## 2020-02-17 DIAGNOSIS — L309 Dermatitis, unspecified: Secondary | ICD-10-CM | POA: Diagnosis not present

## 2020-02-17 DIAGNOSIS — E538 Deficiency of other specified B group vitamins: Secondary | ICD-10-CM | POA: Diagnosis not present

## 2020-06-10 DIAGNOSIS — D225 Melanocytic nevi of trunk: Secondary | ICD-10-CM | POA: Diagnosis not present

## 2020-06-10 DIAGNOSIS — Z1283 Encounter for screening for malignant neoplasm of skin: Secondary | ICD-10-CM | POA: Diagnosis not present

## 2020-06-10 DIAGNOSIS — X32XXXA Exposure to sunlight, initial encounter: Secondary | ICD-10-CM | POA: Diagnosis not present

## 2020-06-10 DIAGNOSIS — L570 Actinic keratosis: Secondary | ICD-10-CM | POA: Diagnosis not present

## 2020-06-10 DIAGNOSIS — L218 Other seborrheic dermatitis: Secondary | ICD-10-CM | POA: Diagnosis not present

## 2020-06-30 DIAGNOSIS — R140 Abdominal distension (gaseous): Secondary | ICD-10-CM | POA: Diagnosis not present

## 2020-06-30 DIAGNOSIS — K5900 Constipation, unspecified: Secondary | ICD-10-CM | POA: Diagnosis not present

## 2020-06-30 DIAGNOSIS — I878 Other specified disorders of veins: Secondary | ICD-10-CM | POA: Diagnosis not present

## 2020-06-30 DIAGNOSIS — R1013 Epigastric pain: Secondary | ICD-10-CM | POA: Diagnosis not present

## 2020-09-02 ENCOUNTER — Other Ambulatory Visit: Payer: Self-pay | Admitting: Family Medicine

## 2020-09-02 DIAGNOSIS — Z1231 Encounter for screening mammogram for malignant neoplasm of breast: Secondary | ICD-10-CM

## 2020-10-19 DIAGNOSIS — H60541 Acute eczematoid otitis externa, right ear: Secondary | ICD-10-CM | POA: Diagnosis not present

## 2020-10-25 DIAGNOSIS — R1011 Right upper quadrant pain: Secondary | ICD-10-CM | POA: Diagnosis not present

## 2020-10-25 DIAGNOSIS — G8929 Other chronic pain: Secondary | ICD-10-CM | POA: Diagnosis not present

## 2020-10-25 DIAGNOSIS — Z01810 Encounter for preprocedural cardiovascular examination: Secondary | ICD-10-CM | POA: Diagnosis not present

## 2020-10-25 DIAGNOSIS — K828 Other specified diseases of gallbladder: Secondary | ICD-10-CM | POA: Diagnosis not present

## 2020-10-25 DIAGNOSIS — Z01818 Encounter for other preprocedural examination: Secondary | ICD-10-CM | POA: Diagnosis not present

## 2020-10-26 ENCOUNTER — Ambulatory Visit
Admission: RE | Admit: 2020-10-26 | Discharge: 2020-10-26 | Disposition: A | Discharge status: Home / Self Care | Payer: Medicare PPO | Source: Ambulatory Visit | Attending: Family Medicine | Admitting: Family Medicine

## 2020-10-26 ENCOUNTER — Other Ambulatory Visit: Payer: Self-pay

## 2020-10-26 DIAGNOSIS — Z1231 Encounter for screening mammogram for malignant neoplasm of breast: Secondary | ICD-10-CM

## 2020-10-27 DIAGNOSIS — K219 Gastro-esophageal reflux disease without esophagitis: Secondary | ICD-10-CM | POA: Diagnosis not present

## 2020-10-27 DIAGNOSIS — Z87891 Personal history of nicotine dependence: Secondary | ICD-10-CM | POA: Diagnosis not present

## 2020-10-27 DIAGNOSIS — K811 Chronic cholecystitis: Secondary | ICD-10-CM | POA: Diagnosis not present

## 2020-10-27 DIAGNOSIS — R1011 Right upper quadrant pain: Secondary | ICD-10-CM | POA: Diagnosis not present

## 2020-10-27 DIAGNOSIS — Z79899 Other long term (current) drug therapy: Secondary | ICD-10-CM | POA: Diagnosis not present

## 2020-11-08 DIAGNOSIS — H60541 Acute eczematoid otitis externa, right ear: Secondary | ICD-10-CM | POA: Diagnosis not present

## 2020-11-12 DIAGNOSIS — Z6827 Body mass index (BMI) 27.0-27.9, adult: Secondary | ICD-10-CM | POA: Diagnosis not present

## 2020-11-12 DIAGNOSIS — Z1389 Encounter for screening for other disorder: Secondary | ICD-10-CM | POA: Diagnosis not present

## 2020-11-12 DIAGNOSIS — G629 Polyneuropathy, unspecified: Secondary | ICD-10-CM | POA: Diagnosis not present

## 2020-11-12 DIAGNOSIS — Z124 Encounter for screening for malignant neoplasm of cervix: Secondary | ICD-10-CM | POA: Diagnosis not present

## 2020-11-12 DIAGNOSIS — M546 Pain in thoracic spine: Secondary | ICD-10-CM | POA: Diagnosis not present

## 2020-11-12 DIAGNOSIS — M2550 Pain in unspecified joint: Secondary | ICD-10-CM | POA: Diagnosis not present

## 2020-11-12 DIAGNOSIS — R5383 Other fatigue: Secondary | ICD-10-CM | POA: Diagnosis not present

## 2020-11-12 DIAGNOSIS — R300 Dysuria: Secondary | ICD-10-CM | POA: Diagnosis not present

## 2020-11-12 DIAGNOSIS — Z01411 Encounter for gynecological examination (general) (routine) with abnormal findings: Secondary | ICD-10-CM | POA: Diagnosis not present

## 2020-11-12 DIAGNOSIS — Z1331 Encounter for screening for depression: Secondary | ICD-10-CM | POA: Diagnosis not present

## 2020-11-12 DIAGNOSIS — E663 Overweight: Secondary | ICD-10-CM | POA: Diagnosis not present

## 2020-11-12 DIAGNOSIS — R739 Hyperglycemia, unspecified: Secondary | ICD-10-CM | POA: Diagnosis not present

## 2020-12-28 DIAGNOSIS — Z111 Encounter for screening for respiratory tuberculosis: Secondary | ICD-10-CM | POA: Diagnosis not present

## 2020-12-28 NOTE — Progress Notes (Signed)
[facility] Note  01/04/2021     CHIEF COMPLAINT Patient presents for Retina Follow Up   HISTORY OF PRESENT ILLNESS: Pamela Lang is a 70 y.o. female who presents to the clinic today for:   HPI     Retina Follow Up   Patient presents with  Other.  In left eye.  Duration of 1 year.  Since onset it is stable.  I, the attending physician,  performed the HPI with the patient and updated documentation appropriately.        Comments   1 year follow up [place] OS-  She has had her glasses updated but has not been happy with the Rx.  She only uses Readers as needed.  Vision stable OU.       Last edited by Bernarda Caffey, MD on 01/04/2021 10:12 AM.      Referring physician: [place], [place], [address] [place], [place] [address]   HISTORICAL INFORMATION:   Selected notes from the MEDICAL RECORD NUMBER Patient referred by Dr. Parke Simmers to evaluate full thickness macular hole OS   CURRENT MEDICATIONS: Current Outpatient Medications (Ophthalmic Drugs)  Medication Sig   atropine 1 % ophthalmic solution Place 1 drop into the left eye 2 (two) times daily.  (Patient not taking: No sig reported)   bacitracin-polymyxin b (POLYSPORIN) ophthalmic ointment Place 1 application into the left eye 4 (four) times daily. apply to eye every 12 hours while awake (Patient not taking: No sig reported)   brimonidine (ALPHAGAN) 0.2 % ophthalmic solution Place 1 drop into the left eye daily. (Patient not taking: No sig reported)   gatifloxacin (ZYMAXID) 0.5 % SOLN Place 1 drop into the left eye 4 (four) times daily. (Patient not taking: No sig reported)   prednisoLONE acetate (PRED FORTE) 1 % ophthalmic suspension Place 1 drop into the left eye 4 (four) times daily. (Patient not taking: No sig reported)   No current facility-administered medications for this visit. (Ophthalmic Drugs)   Current Outpatient Medications (Other)  Medication Sig   famotidine  (PEPCID) 20 MG tablet Take 20 mg by mouth as needed for heartburn or indigestion.   ALPRAZolam (XANAX) 0.25 MG tablet Take 0.25-0.5 mg by mouth every 6 (six) hours as needed for anxiety. (Patient not taking: Reported on 01/04/2021)   BREO ELLIPTA 200-25 MCG/INH AEPB  (Patient not taking: Reported on 01/04/2021)   diltiazem (CARDIZEM) 30 MG tablet  (Patient not taking: Reported on 01/04/2021)   omeprazole (PRILOSEC) 20 MG capsule  (Patient not taking: Reported on 01/04/2021)   ondansetron (ZOFRAN-ODT) 4 MG disintegrating tablet  (Patient not taking: Reported on 01/04/2021)   sertraline (ZOLOFT) 25 MG tablet  (Patient not taking: Reported on 01/04/2021)   No current facility-administered medications for this visit. (Other)   REVIEW OF SYSTEMS: ROS   Positive for: Cardiovascular, Eyes Negative for: Constitutional, Gastrointestinal, Neurological, Skin, Genitourinary, Musculoskeletal, HENT, Endocrine, Respiratory, Psychiatric, Allergic/Imm, Heme/Lymph Last edited by Leonie Douglas, COA on 01/04/2021  9:49 AM.     ALLERGIES Allergies  Allergen Reactions   Ceclor [Cefaclor] Nausea And Vomiting and Other (See Comments)    Severe stomach cramps   Cortisone Other (See Comments)    TIA   Propranolol Other (See Comments)    Decreased heart rate   Aspirin Other (See Comments)    Irritates ulcers    Augmentin [Amoxicillin-Pot Clavulanate] Nausea And Vomiting   Ciprodex [Ciprofloxacin-Dexamethasone]     Migraines   Codeine Nausea And  Vomiting   Darvocet [Propoxyphene N-Acetaminophen] Nausea And Vomiting   Diazepam     Tachycardia    Doxycycline Nausea And Vomiting   Omnaris [Ciclesonide]     Migraines    Other     ALL STEROIDS CAUSE TACHYCARDIA    Sulfa Antibiotics Other (See Comments)    Migraine headache   Sumatriptan Other (See Comments)    Tachycardia    Zofran [Ondansetron Hcl]     Severe Migraines, Tachycardia    Zoloft [Sertraline Hcl]     Tachycardia     PAST MEDICAL HISTORY Past  Medical History:  Diagnosis Date   Anxiety    Eczema    ear   GERD (gastroesophageal reflux disease)    diet controlled   Headache    occasional - last one in 2019   IBS (irritable bowel syndrome)    MVP (mitral valve prolapse)    no problems   Palpitations    EKG 12/24/18 and ECHO 01/22/19 normal in [facility]  - rx Diltiazem cd 120 mg qd but has not started until after 02/06/19 procedure with  Coralyn Pear   PONV (postoperative nausea and vomiting)    SVT (supraventricular tachycardia) ([place])    TIA (transient ischemic attack) 2012   TIA   Past Surgical History:  Procedure Laterality Date   25 GAUGE PARS PLANA VITRECTOMY WITH 20 GAUGE MVR PORT FOR MACULAR HOLE Left 02/06/2019   Procedure: 25 GAUGE PARS PLANA VITRECTOMY WITH 20 GAUGE MVR PORT FOR MACULAR HOLE;  Surgeon: Bernarda Caffey, MD;  Location: [facility];  Service: Ophthalmology;  Laterality: Left;   BREAST CYST ASPIRATION Right    CATARACT EXTRACTION Bilateral 2016   Dr. Herbert Deaner   CESAREAN SECTION     x 2   CHOLECYSTECTOMY     COLONOSCOPY     Cyst B/L Wrist Bilateral    GAS/FLUID EXCHANGE Left 02/06/2019   Procedure: Gas/Fluid Exchange;  Surgeon: Bernarda Caffey, MD;  Location: [facility];  Service: Ophthalmology;  Laterality: Left;  C3F8   PHOTOCOAGULATION WITH LASER Left 02/06/2019   Procedure: Photocoagulation With Laser;  Surgeon: Bernarda Caffey, MD;  Location: [facility];  Service: Ophthalmology;  Laterality: Left;   TOOTH EXTRACTION Bilateral    wisdom toothe ext   TUBAL LIGATION      FAMILY HISTORY Family History  Problem Relation Age of Onset   COPD Father    Heart disease Father    Diabetes Sister    Diabetes Sister     SOCIAL HISTORY Social History   Tobacco Use   Smoking status: Former    Types: Cigarettes   Smokeless tobacco: Former   Tobacco comments:    quit 30 yrs ago per patient 02/06/19  Vaping Use   Vaping Use: Never used  Substance Use Topics   Alcohol use: No   Drug use: No       OPHTHALMIC EXAM: Base Eye  Exam     Visual Acuity (Snellen - Linear)       Right Left   Dist [place] 20/20 -2 20/50 +2   Dist ph [place]  20/40 +2  Patient did not bring her glasses. OS letters are "wavy"        Tonometry (Tonopen, 9:57 AM)       Right Left   Pressure 14 10         Pupils       Dark Light Shape React APD   Right 3 2 Round Brisk None   Left 4 3  Round Brisk None         Visual Fields (Counting fingers)       Left Right    Full Full         Extraocular Movement       Right Left    Full Full         Neuro/Psych     Oriented x3: Yes   Mood/Affect: Normal         Dilation     Both eyes: 1.0% Mydriacyl, 2.5% Phenylephrine @ 9:57 AM           Slit Lamp and Fundus Exam     Slit Lamp Exam       Right Left   Lids/Lashes Mild Meibomian gland dysfunction Mild Meibomian gland dysfunction, Dermatochalasis - upper lid   Conjunctiva/Sclera White and quiet White and quiet   Cornea Mild Arcus, trace Punctate epithelial erosions Trace Punctate epithelial erosions, mild Arcus, trace, fine endo pigment   Anterior Chamber Deep and quiet Deep and quiet   Iris Round and dilated Round and dilated   Lens PC IOL in good position PC IOL in good position with open PC   Vitreous Vitreous syneresis post vitrectomy, clear         Fundus Exam       Right Left   Disc Pink and Sharp Pink and Sharp   C/D Ratio 0.5 0.5   Macula Flat, Blunted foveal reflex, trace Epiretinal membrane, No heme or edema Flat, blunted foveal reflex, mac hole closed, mild Retinal pigment epithelial mottling, No heme or edema   Vessels Mild Vascular attenuation, mild Tortuousity, mild AV crossing changes, mild Copper wiring Vascular attenuation, mild Tortuousity, mild AV crossing changes, mild Copper wiring   Periphery Attached, no heme    Attached, pigmented choroidal lesion (nevus) at 0300 with +drusen, no SRF or orange pigment           IMAGING AND PROCEDURES  Imaging and Procedures for  _0 @  OCT, Retina - OU - Both Eyes       Right Eye Quality was good. Central Foveal Thickness: 247. Progression has been stable. Findings include normal foveal contour, no IRF, no SRF, epiretinal membrane (Interval release of VMA to partial PVD, trace ERM).   Left Eye Quality was good. Central Foveal Thickness: 280. Progression has been stable. Findings include no SRF, normal foveal contour, no IRF (Mac hole closed; mild interval improvement in ellipsoid signal, mild progression of inner retinal irregularity temporal macula).   Notes *Images captured and stored on drive  Diagnosis / Impression:  OD: NFP, no IRF/SRF OS: Mac hole closed; mild interval improvement in ellipsoid signal, mild progression of inner retinal irregularity temporal macula  Clinical management:  See below  Abbreviations: NFP - Normal foveal profile. CME - cystoid macular edema. PED - pigment epithelial detachment. IRF - intraretinal fluid. SRF - subretinal fluid. EZ - ellipsoid zone. ERM - epiretinal membrane. ORA - outer retinal atrophy. ORT - outer retinal tubulation. SRHM - subretinal hyper-reflective material            ASSESSMENT/PLAN:    ICD-10-CM   1. Macular hole of left eye  H35.342     2. Epiretinal membrane (ERM) of left eye  H35.372     3. Retinal edema  H35.81 OCT, Retina - OU - Both Eyes    4. 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 Pseudophakia of both eyes  Z96.1  6. Left posterior capsular opacification  H26.492       1-3.  Macular Hole w/ ERM OS  - small full thickness macular hole  - +metamorphopsia, distortion centrally  - s/p PPV/TissueBlue stain/MP/14% C3F8 OS, 12.10.20  - OCT shows mac hole closed; mild interval improvement in ellipsoid signal, inner retinal irregularity temporal macula - persistent             - retina attached              - IOP okay at 13  - f/u 1 year, DFE, OCT  4. Choroidal Nevus OS -- stable  - relatively flat pigmented lesion  -  located at 0300 periphery with +drusen, no SRF/orange pigment  - no suspicious features  - discussed findngs, prognosis  - baseline images done on 12.22.20  - monitor   5,6. Pseudophakia OU  - s/p CE/IOL OU (Dr. Herbert Deaner, 2016)  - s/p YAG capsulotomy OS (11.18.20)  - beautiful surgeries, doing well  - monitor   Ophthalmic Meds Ordered this visit:  No orders of the defined types were placed in this encounter.      Return in about 1 year (around 01/04/2022) for f/u [place] OS, DFE, OCT.  There are no Patient Instructions on file for this visit.  This document serves as a record of services personally performed by Gardiner Sleeper, MD, PhD. It was created on their behalf by Estill Bakes, COT an ophthalmic technician. The creation of this record is the provider's dictation and/or activities during the visit.    Electronically signed by: Estill Bakes, COT 11.1.22 @ 2:32 PM   This document serves as a record of services personally performed by Gardiner Sleeper, MD, PhD. It was created on their behalf by San Jetty. Owens Shark, OA an ophthalmic technician. The creation of this record is the provider's dictation and/or activities during the visit.    Electronically signed by: San Jetty. Marguerita Merles 11.08.2022 2:32 PM   Gardiner Sleeper, M.D., Ph.D. Diseases & Surgery of the Retina and Vitreous [facility]  I have reviewed the above documentation for accuracy and completeness, and I agree with the above. Gardiner Sleeper, M.D., Ph.D. 01/06/21 2:33 PM   Abbreviations: M myopia (nearsighted); A astigmatism; H hyperopia (farsighted); P presbyopia; Mrx spectacle prescription;  CTL contact lenses; OD right eye; OS left eye; OU both eyes  XT exotropia; ET esotropia; PEK punctate epithelial keratitis; PEE punctate epithelial erosions; DES dry eye syndrome; MGD meibomian gland dysfunction; ATs artificial tears; PFAT's preservative free artificial tears; [place] nuclear sclerotic cataract; PSC  posterior subcapsular cataract; ERM epi-retinal membrane; PVD posterior vitreous detachment; RD retinal detachment; DM diabetes mellitus; DR diabetic retinopathy; NPDR non-proliferative diabetic retinopathy; PDR proliferative diabetic retinopathy; CSME clinically significant macular edema; DME diabetic macular edema; dbh dot blot hemorrhages; CWS cotton wool spot; POAG primary open angle glaucoma; C/D cup-to-disc ratio; HVF humphrey visual field; GVF goldmann visual field; OCT optical coherence tomography; IOP intraocular pressure; BRVO Branch retinal vein occlusion; CRVO central retinal vein occlusion; CRAO central retinal artery occlusion; BRAO branch retinal artery occlusion; RT retinal tear; SB scleral buckle; PPV pars plana vitrectomy; VH Vitreous hemorrhage; PRP panretinal laser photocoagulation; IVK intravitreal kenalog; VMT vitreomacular traction; MH Macular hole;  NVD neovascularization of the disc; NVE neovascularization elsewhere; AREDS age related eye disease study; ARMD age related macular degeneration; POAG primary open angle glaucoma; EBMD epithelial/anterior basement membrane dystrophy; ACIOL anterior chamber intraocular lens; IOL intraocular lens; PCIOL  posterior chamber intraocular lens; Phaco/IOL phacoemulsification with intraocular lens placement; [place] photorefractive keratectomy; LASIK laser assisted in situ keratomileusis; HTN hypertension; DM diabetes mellitus; COPD chronic obstructive pulmonary disease

## 2021-01-04 ENCOUNTER — Encounter (INDEPENDENT_AMBULATORY_CARE_PROVIDER_SITE_OTHER): Payer: Self-pay | Admitting: Ophthalmology

## 2021-01-04 ENCOUNTER — Ambulatory Visit (INDEPENDENT_AMBULATORY_CARE_PROVIDER_SITE_OTHER): Payer: Medicare PPO | Admitting: Ophthalmology

## 2021-01-04 ENCOUNTER — Other Ambulatory Visit: Payer: Self-pay

## 2021-01-04 DIAGNOSIS — Z961 Presence of intraocular lens: Secondary | ICD-10-CM

## 2021-01-04 DIAGNOSIS — H26492 Other secondary cataract, left eye: Secondary | ICD-10-CM

## 2021-01-04 DIAGNOSIS — H35372 Puckering of macula, left eye: Secondary | ICD-10-CM | POA: Diagnosis not present

## 2021-01-04 DIAGNOSIS — D3132 Benign neoplasm of left choroid: Secondary | ICD-10-CM | POA: Diagnosis not present

## 2021-01-04 DIAGNOSIS — H3581 Retinal edema: Secondary | ICD-10-CM

## 2021-01-04 DIAGNOSIS — H35342 Macular cyst, hole, or pseudohole, left eye: Secondary | ICD-10-CM

## 2021-01-06 DIAGNOSIS — Z23 Encounter for immunization: Secondary | ICD-10-CM | POA: Diagnosis not present

## 2021-01-11 DIAGNOSIS — H60541 Acute eczematoid otitis externa, right ear: Secondary | ICD-10-CM | POA: Diagnosis not present

## 2021-03-03 DIAGNOSIS — U071 COVID-19: Secondary | ICD-10-CM | POA: Diagnosis not present

## 2021-03-03 DIAGNOSIS — J018 Other acute sinusitis: Secondary | ICD-10-CM | POA: Diagnosis not present

## 2021-03-16 DIAGNOSIS — H60541 Acute eczematoid otitis externa, right ear: Secondary | ICD-10-CM | POA: Diagnosis not present

## 2021-04-18 IMAGING — MG DIGITAL SCREENING BILAT W/ TOMO W/ CAD
8 series · 8 of 24 positions shown · non-contrast
Comparison: Previous exam(s).

CLINICAL DATA: Screening.

EXAM:
DIGITAL SCREENING BILATERAL MAMMOGRAM WITH TOMO AND CAD

[L CC synth-2D]
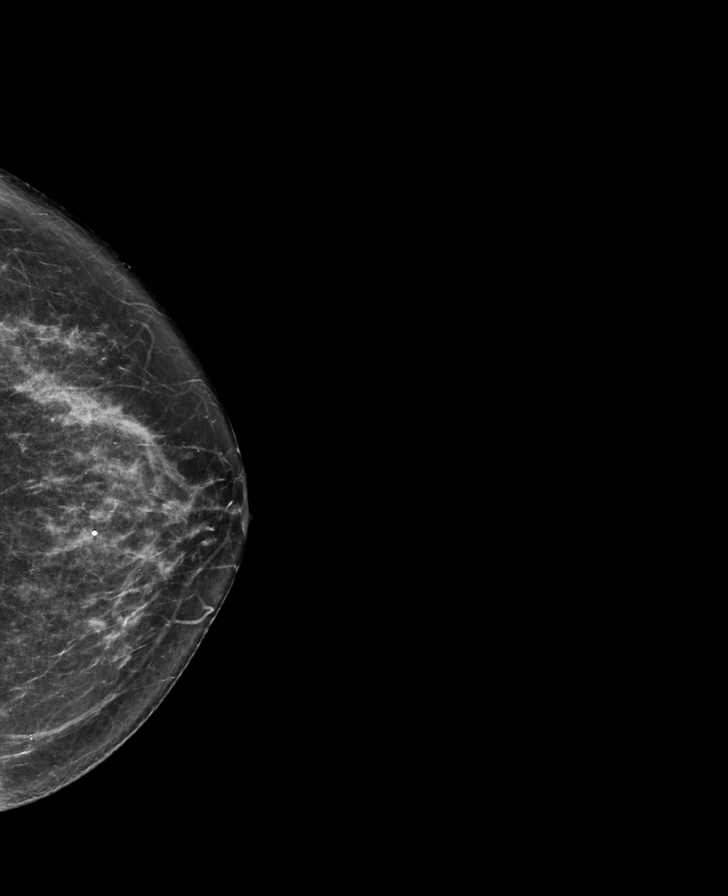

[R MLO synth-2D]
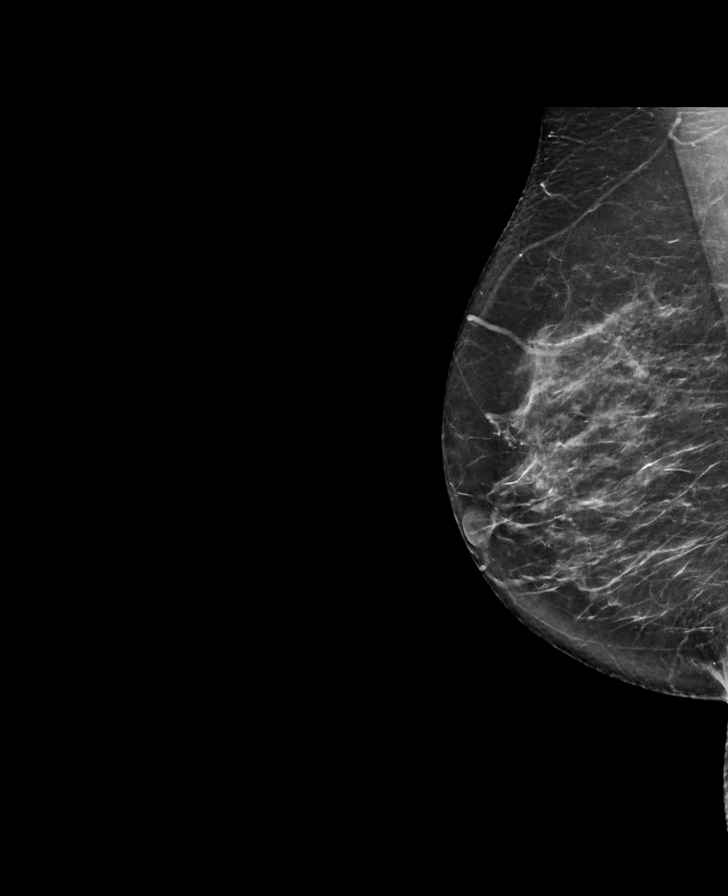

[L MLO synth-2D]
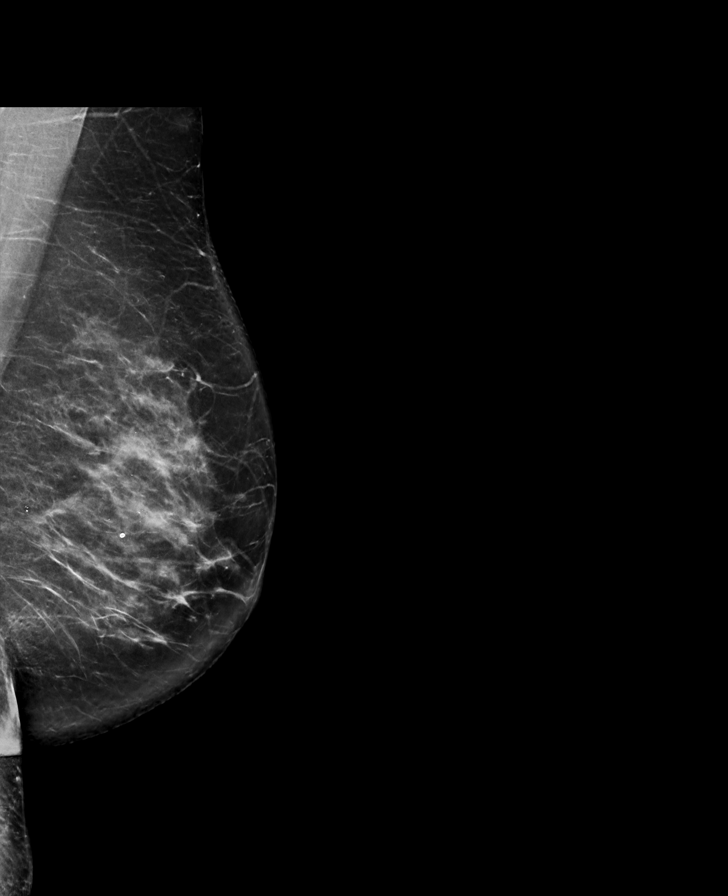

[R CC synth-2D]
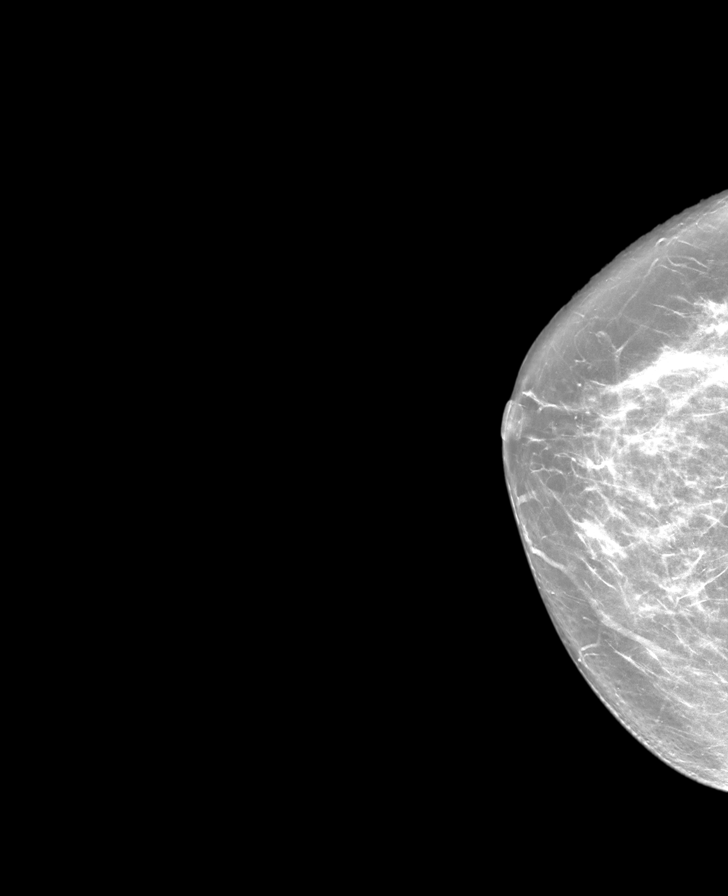

[R CC tomo · tomo slice 37/72.0]
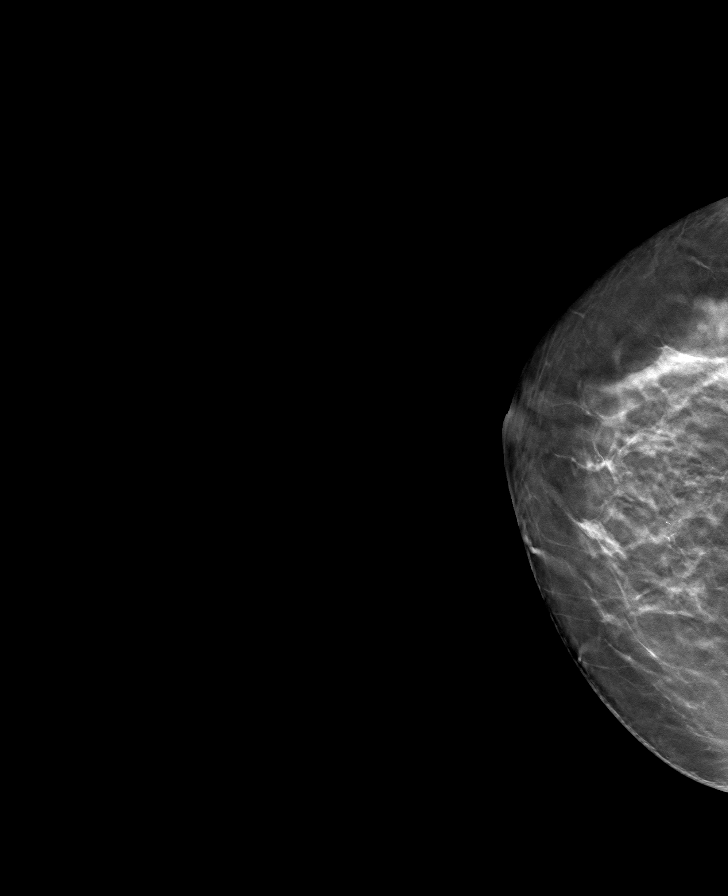

[R MLO tomo · tomo slice 39/78.0]
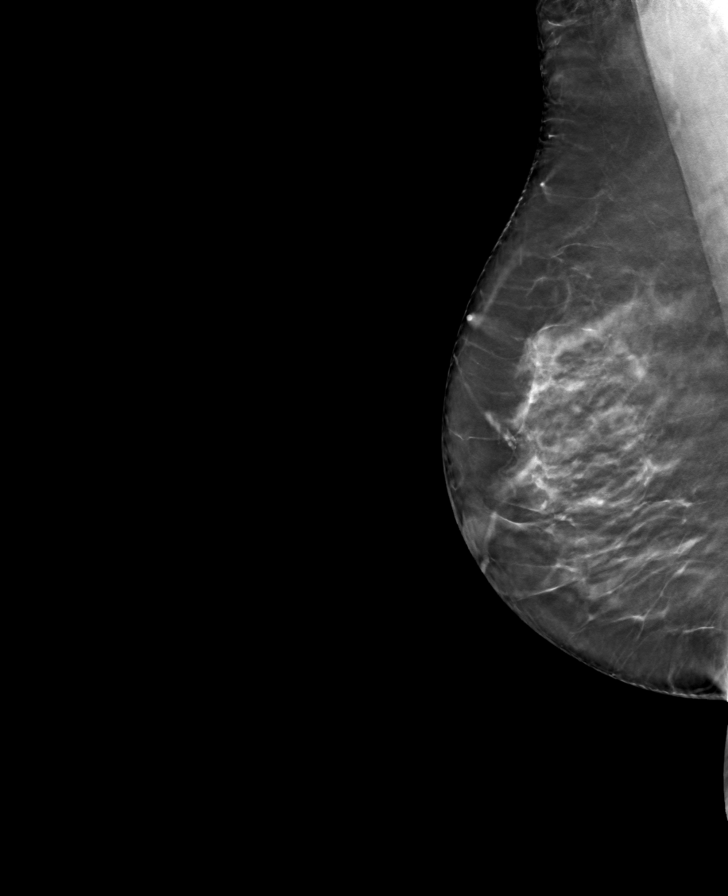

[L CC tomo · tomo slice 35/70.0]
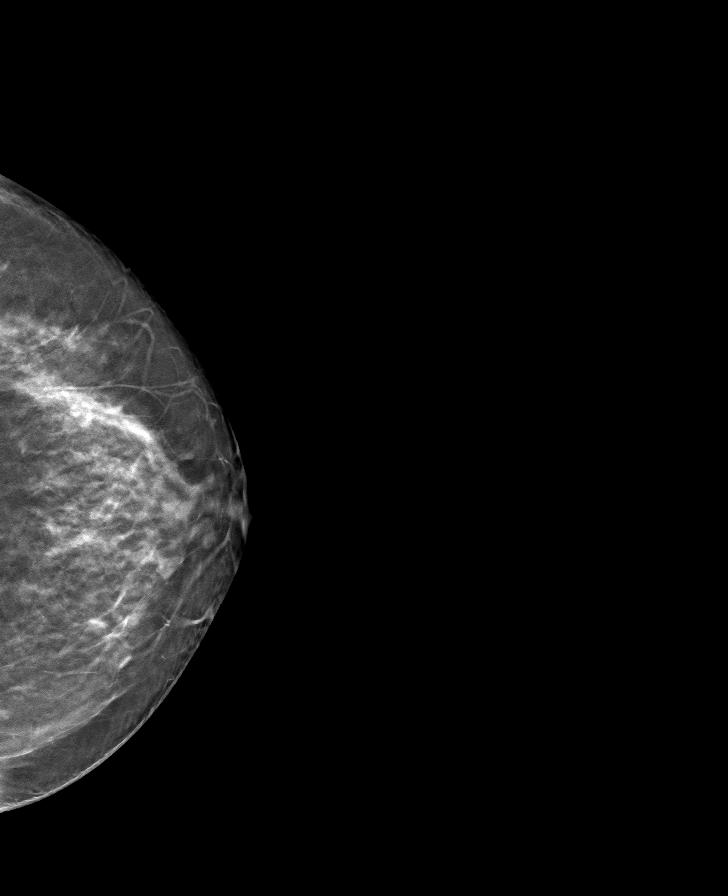

[L MLO tomo · tomo slice 43/85.0]
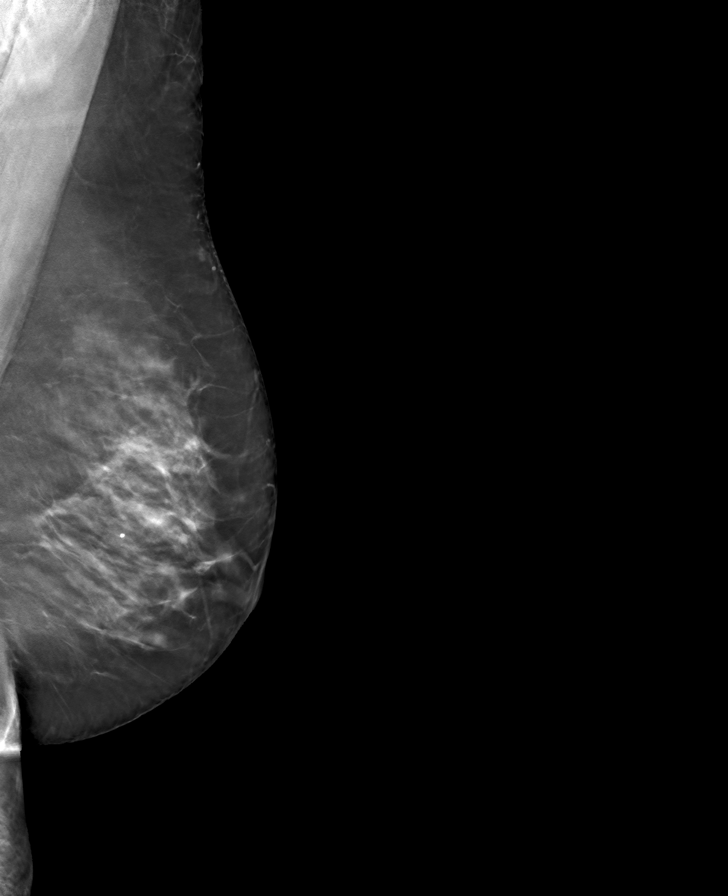

[8 of 24 positions shown; findings below may reference images not displayed]

ACR Breast Density Category c: The breast tissue is heterogeneously
dense, which may obscure small masses.
FINDINGS: There are no findings suspicious for malignancy. Images were
processed with CAD.
IMPRESSION: No mammographic evidence of malignancy. A result letter of this
screening mammogram will be mailed directly to the patient.

RECOMMENDATION:
Screening mammogram in one year. (Code:[id number])

BI-RADS CATEGORY  1: Negative.

## 2021-06-27 DIAGNOSIS — J019 Acute sinusitis, unspecified: Secondary | ICD-10-CM | POA: Diagnosis not present

## 2021-06-27 DIAGNOSIS — J209 Acute bronchitis, unspecified: Secondary | ICD-10-CM | POA: Diagnosis not present

## 2021-06-27 DIAGNOSIS — J309 Allergic rhinitis, unspecified: Secondary | ICD-10-CM | POA: Diagnosis not present

## 2021-10-08 DIAGNOSIS — J029 Acute pharyngitis, unspecified: Secondary | ICD-10-CM | POA: Diagnosis not present

## 2021-10-13 DIAGNOSIS — I471 Supraventricular tachycardia: Secondary | ICD-10-CM | POA: Diagnosis not present

## 2021-10-13 DIAGNOSIS — E538 Deficiency of other specified B group vitamins: Secondary | ICD-10-CM | POA: Diagnosis not present

## 2021-10-13 DIAGNOSIS — R7309 Other abnormal glucose: Secondary | ICD-10-CM | POA: Diagnosis not present

## 2021-10-13 DIAGNOSIS — Z6827 Body mass index (BMI) 27.0-27.9, adult: Secondary | ICD-10-CM | POA: Diagnosis not present

## 2021-10-13 DIAGNOSIS — Z1331 Encounter for screening for depression: Secondary | ICD-10-CM | POA: Diagnosis not present

## 2021-10-13 DIAGNOSIS — Z0001 Encounter for general adult medical examination with abnormal findings: Secondary | ICD-10-CM | POA: Diagnosis not present

## 2021-10-13 DIAGNOSIS — J020 Streptococcal pharyngitis: Secondary | ICD-10-CM | POA: Diagnosis not present

## 2021-11-28 ENCOUNTER — Other Ambulatory Visit: Payer: Self-pay | Admitting: Family Medicine

## 2021-11-28 DIAGNOSIS — Z1231 Encounter for screening mammogram for malignant neoplasm of breast: Secondary | ICD-10-CM

## 2021-12-14 ENCOUNTER — Encounter (HOSPITAL_BASED_OUTPATIENT_CLINIC_OR_DEPARTMENT_OTHER): Payer: Self-pay

## 2021-12-14 DIAGNOSIS — M9905 Segmental and somatic dysfunction of pelvic region: Secondary | ICD-10-CM | POA: Diagnosis not present

## 2021-12-14 DIAGNOSIS — M9903 Segmental and somatic dysfunction of lumbar region: Secondary | ICD-10-CM | POA: Diagnosis not present

## 2021-12-14 DIAGNOSIS — M62830 Muscle spasm of back: Secondary | ICD-10-CM | POA: Diagnosis not present

## 2021-12-14 DIAGNOSIS — M9902 Segmental and somatic dysfunction of thoracic region: Secondary | ICD-10-CM | POA: Diagnosis not present

## 2021-12-19 DIAGNOSIS — M9905 Segmental and somatic dysfunction of pelvic region: Secondary | ICD-10-CM | POA: Diagnosis not present

## 2021-12-19 DIAGNOSIS — M62830 Muscle spasm of back: Secondary | ICD-10-CM | POA: Diagnosis not present

## 2021-12-19 DIAGNOSIS — M9902 Segmental and somatic dysfunction of thoracic region: Secondary | ICD-10-CM | POA: Diagnosis not present

## 2021-12-19 DIAGNOSIS — M9903 Segmental and somatic dysfunction of lumbar region: Secondary | ICD-10-CM | POA: Diagnosis not present

## 2021-12-20 NOTE — Progress Notes (Signed)
[facility] Note  01/03/2022     CHIEF COMPLAINT Patient presents for Retina Follow Up   HISTORY OF PRESENT ILLNESS: Pamela Lang is a 71 y.o. female who presents to the clinic today for:   HPI     Retina Follow Up   Patient presents with  Other.  In left eye.  This started years ago.  Duration of 1 year.  Since onset it is stable.  I, the attending physician,  performed the HPI with the patient and updated documentation appropriately.        Comments   Patient states she feels that she needs a new good glasses prescription.  So she keeps using readers.       Last edited by Bernarda Caffey, MD on 01/04/2022  9:50 PM.    Pt states vision is stable, pt is wearing a heart monitor bc her heart rate was elevated at the cardiologist office yesterday   Referring physician: [place], [place], [place], [place] [address]   HISTORICAL INFORMATION:   Selected notes from the MEDICAL RECORD NUMBER Patient referred by Dr. Parke Simmers to evaluate full thickness macular hole OS   CURRENT MEDICATIONS: Current Outpatient Medications (Ophthalmic Drugs)  Medication Sig   atropine 1 % ophthalmic solution Place 1 drop into the left eye 2 (two) times daily.  (Patient not taking: Reported on 01/05/2020)   bacitracin-polymyxin b (POLYSPORIN) ophthalmic ointment Place 1 application into the left eye 4 (four) times daily. apply to eye every 12 hours while awake (Patient not taking: Reported on 01/05/2020)   brimonidine (ALPHAGAN) 0.2 % ophthalmic solution Place 1 drop into the left eye daily. (Patient not taking: Reported on 01/05/2020)   gatifloxacin (ZYMAXID) 0.5 % SOLN Place 1 drop into the left eye 4 (four) times daily. (Patient not taking: Reported on 01/05/2020)   prednisoLONE acetate (PRED FORTE) 1 % ophthalmic suspension Place 1 drop into the left eye 4 (four) times daily. (Patient not taking: Reported on 01/05/2020)   No current  facility-administered medications for this visit. (Ophthalmic Drugs)   Current Outpatient Medications (Other)  Medication Sig   ALPRAZolam (XANAX) 0.25 MG tablet Take 0.25-0.5 mg by mouth every 6 (six) hours as needed for anxiety. (Patient not taking: Reported on 01/04/2021)   BREO ELLIPTA 200-25 MCG/INH AEPB  (Patient not taking: Reported on 01/04/2021)   diltiazem (CARDIZEM) 30 MG tablet  (Patient not taking: Reported on 01/04/2021)   famotidine (PEPCID) 20 MG tablet Take 20 mg by mouth as needed for heartburn or indigestion.   omeprazole (PRILOSEC) 20 MG capsule  (Patient not taking: Reported on 01/04/2021)   ondansetron (ZOFRAN-ODT) 4 MG disintegrating tablet  (Patient not taking: Reported on 01/04/2021)   sertraline (ZOLOFT) 25 MG tablet  (Patient not taking: Reported on 01/04/2021)   No current facility-administered medications for this visit. (Other)   REVIEW OF SYSTEMS: ROS   Positive for: Cardiovascular, Eyes Negative for: Constitutional, Gastrointestinal, Neurological, Skin, Genitourinary, Musculoskeletal, HENT, Endocrine, Respiratory, Psychiatric, Allergic/Imm, Heme/Lymph Last edited by Annie Paras, COT on 01/03/2022  9:50 AM.     ALLERGIES Allergies  Allergen Reactions   Ceclor [Cefaclor] Nausea And Vomiting and Other (See Comments)    Severe stomach cramps   Cortisone Other (See Comments)    TIA   Propranolol Other (See Comments)    Decreased heart rate   Aspirin Other (See Comments)    Irritates ulcers    Augmentin [Amoxicillin-Pot Clavulanate] Nausea And  Vomiting   Ciprodex [Ciprofloxacin-Dexamethasone]     Migraines   Codeine Nausea And Vomiting   Darvocet [Propoxyphene N-Acetaminophen] Nausea And Vomiting   Diazepam     Tachycardia    Doxycycline Nausea And Vomiting   Omnaris [Ciclesonide]     Migraines    Other     ALL STEROIDS CAUSE TACHYCARDIA    Sulfa Antibiotics Other (See Comments)    Migraine headache   Sumatriptan Other (See Comments)     Tachycardia    Zofran [Ondansetron Hcl]     Severe Migraines, Tachycardia    Zoloft [Sertraline Hcl]     Tachycardia    PAST MEDICAL HISTORY 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   MVP (mitral valve prolapse)    no problems   Palpitations    EKG 12/24/18 and ECHO 01/22/19 normal in [facility]  - rx Diltiazem cd 120 mg qd but has not started until after 02/06/19 procedure with  Coralyn Pear   PONV (postoperative nausea and vomiting)    SVT (supraventricular tachycardia)    TIA (transient ischemic attack) 2012   TIA   Past Surgical History:  Procedure Laterality Date   25 GAUGE PARS PLANA VITRECTOMY WITH 20 GAUGE MVR PORT FOR MACULAR HOLE Left 02/06/2019   Procedure: 25 GAUGE PARS PLANA VITRECTOMY WITH 20 GAUGE MVR PORT FOR MACULAR HOLE;  Surgeon: Bernarda Caffey, MD;  Location: [place];  Service: Ophthalmology;  Laterality: Left;   BREAST CYST ASPIRATION Right    CATARACT EXTRACTION Bilateral 2016   Dr. Herbert Deaner   CESAREAN SECTION     x 2   CHOLECYSTECTOMY     COLONOSCOPY     Cyst B/L Wrist Bilateral    GAS/FLUID EXCHANGE Left 02/06/2019   Procedure: Gas/Fluid Exchange;  Surgeon: Bernarda Caffey, MD;  Location: [place];  Service: Ophthalmology;  Laterality: Left;  C3F8   PHOTOCOAGULATION WITH LASER Left 02/06/2019   Procedure: Photocoagulation With Laser;  Surgeon: Bernarda Caffey, MD;  Location: [facility];  Service: Ophthalmology;  Laterality: Left;   TOOTH EXTRACTION Bilateral    wisdom toothe ext   TUBAL LIGATION     FAMILY HISTORY Family History  Problem Relation Age of Onset   Transient ischemic attack Mother    COPD Father    Heart disease Father    Diabetes Sister    Diabetes Sister    SOCIAL HISTORY Social History   Tobacco Use   Smoking status: Former    Types: Cigarettes    Smokeless tobacco: Former   Tobacco comments:    quit 30 yrs ago per patient 02/06/19  Vaping Use   Vaping Use: Never used  Substance Use Topics   Alcohol use: No   Drug use: No       OPHTHALMIC EXAM: Base Eye Exam     Visual Acuity (Snellen - Linear)       Right Left   Dist [place] 20/20 +1 20/40   Dist ph [place]  20/30         Tonometry (Tonopen, 9:54 AM)       Right Left   Pressure 14 13         Pupils       Dark Light Shape React APD  Right 3 2 Round Brisk None   Left 3 2 Round Brisk None         Visual Fields       Left Right    Full Full         Extraocular Movement       Right Left    Full, Ortho Full, Ortho         Neuro/Psych     Oriented x3: Yes   Mood/Affect: Normal         Dilation     Both eyes: 1.0% Mydriacyl, 2.5% Phenylephrine @ 9:51 AM           Slit Lamp and Fundus Exam     Slit Lamp Exam       Right Left   Lids/Lashes Dermatochalasis - upper lid, mild MGD Dermatochalasis - upper lid, Meibomian gland dysfunction   Conjunctiva/Sclera White and quiet White and quiet   Cornea Arcus, trace Punctate epithelial erosions, tear film debris Trace Punctate epithelial erosions, mild Arcus, trace, fine endo pigment   Anterior Chamber Deep and quiet Deep and quiet   Iris Round and dilated Round and dilated   Lens PC IOL in good position with open PC PC IOL in good position with open PC   Anterior Vitreous Vitreous syneresis post vitrectomy, clear         Fundus Exam       Right Left   Disc Pink and Sharp, mild PPP Pink and Sharp   C/D Ratio 0.5 0.6   Macula Flat, good foveal reflex, trace Epiretinal membrane, No heme or edema Flat, blunted foveal reflex, mac hole closed, mild Retinal pigment epithelial mottling, No heme or edema   Vessels Mild attenuation, mild Tortuosity, mild AV crossing changes, mild Copper wiring attenuated, mild tortuosity   Periphery Attached, no heme    Attached, pigmented choroidal lesion (nevus)  at 0300 with +drusen, no SRF or orange pigment           IMAGING AND PROCEDURES  Imaging and Procedures for _0 @  OCT, Retina - OU - Both Eyes       Right Eye Quality was good. Central Foveal Thickness: 249. Progression has been stable. Findings include normal foveal contour, no IRF, no SRF, epiretinal membrane (stable release of VMA to partial PVD, trace ERM).   Left Eye Quality was good. Central Foveal Thickness: 281. Progression has been stable. Findings include no IRF, no SRF, abnormal foveal contour (Mac hole closed; mild interval improvement in ellipsoid signal, mild inner retinal irregularity temporal macula).   Notes *Images captured and stored on drive  Diagnosis / Impression:  OD: NFP, no IRF/SRF OS: Mac hole closed; mild interval improvement in ellipsoid signal, mild inner retinal irregularity temporal macula  Clinical management:  See below  Abbreviations: NFP - Normal foveal profile. CME - cystoid macular edema. PED - pigment epithelial detachment. IRF - intraretinal fluid. SRF - subretinal fluid. EZ - ellipsoid zone. ERM - epiretinal membrane. ORA - outer retinal atrophy. ORT - outer retinal tubulation. SRHM - subretinal hyper-reflective material            ASSESSMENT/PLAN:    ICD-10-CM   1. Macular hole of left eye  H35.342 OCT, Retina - OU - Both Eyes    2. Epiretinal membrane (ERM) of left eye  H35.372     3. 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 Pseudophakia of both eyes  Z96.1     5.  Left posterior capsular opacification  H26.492      1,2.  Macular Hole w/ ERM OS  - small full thickness macular hole  - +metamorphopsia, distortion centrally  - s/p PPV/TissueBlue stain/MP/14% C3F8 OS, 12.10.20  - OCT shows Mac hole closed; mild interval improvement in ellipsoid signal, mild inner retinal irregularity temporal macula             - retina attached              - IOP okay at 13  - f/u 1 year, DFE, OCT  3. Choroidal Nevus OS --  stable  - relatively flat pigmented lesion  - located at 0300 periphery with +drusen, no SRF/orange pigment  - no suspicious features  - discussed findngs, prognosis  - baseline Optos images done on 12.22.20  - monitor   4,5. Pseudophakia OU  - s/p CE/IOL OU (Dr. Herbert Deaner, 2016)  - s/p YAG capsulotomy OS (11.18.20)  - beautiful surgeries, doing well  - monitor  Ophthalmic Meds Ordered this visit:  No orders of the defined types were placed in this encounter.    Return in about 1 year (around 01/04/2023) for f/u mac hole OS, DFE, OCT.  There are no Patient Instructions on file for this visit.  This document serves as a record of services personally performed by Gardiner Sleeper, MD, PhD. It was created on their behalf by Renaldo Reel, [place] an ophthalmic technician. The creation of this record is the provider's dictation and/or activities during the visit.    Electronically signed by:  Renaldo Reel, COT  10.24.23 9:50 PM  This document serves as a record of services personally performed by Gardiner Sleeper, MD, PhD. It was created on their behalf by San Jetty. Owens Shark, OA an ophthalmic technician. The creation of this record is the provider's dictation and/or activities during the visit.    Electronically signed by: San Jetty. Owens Shark, [place] 11.07.2023 9:50 PM  Gardiner Sleeper, M.D., Ph.D. Diseases & Surgery of the Retina and Vitreous [facility]  I have reviewed the above documentation for accuracy and completeness, and I agree with the above. Gardiner Sleeper, M.D., Ph.D. 01/04/22 9:51 PM  Abbreviations: M myopia (nearsighted); A astigmatism; H hyperopia (farsighted); P presbyopia; Mrx spectacle prescription;  CTL contact lenses; OD right eye; OS left eye; OU both eyes  XT exotropia; ET esotropia; PEK punctate epithelial keratitis; PEE punctate epithelial erosions; DES dry eye syndrome; MGD meibomian gland dysfunction; ATs artificial tears; PFAT's preservative  free artificial tears; [place] nuclear sclerotic cataract; PSC posterior subcapsular cataract; ERM epi-retinal membrane; PVD posterior vitreous detachment; RD retinal detachment; DM diabetes mellitus; DR diabetic retinopathy; NPDR non-proliferative diabetic retinopathy; PDR proliferative diabetic retinopathy; CSME clinically significant macular edema; DME diabetic macular edema; dbh dot blot hemorrhages; CWS cotton wool spot; POAG primary open angle glaucoma; C/D cup-to-disc ratio; HVF humphrey visual field; GVF goldmann visual field; OCT optical coherence tomography; IOP intraocular pressure; BRVO Branch retinal vein occlusion; CRVO central retinal vein occlusion; CRAO central retinal artery occlusion; BRAO branch retinal artery occlusion; RT retinal tear; SB scleral buckle; PPV pars plana vitrectomy; VH Vitreous hemorrhage; PRP panretinal laser photocoagulation; IVK intravitreal kenalog; VMT vitreomacular traction; MH Macular hole;  NVD neovascularization of the disc; NVE neovascularization elsewhere; AREDS age related eye disease study; ARMD age related macular degeneration; POAG primary open angle glaucoma; EBMD epithelial/anterior basement membrane dystrophy; ACIOL anterior chamber intraocular lens; IOL intraocular lens; PCIOL posterior chamber intraocular lens; Phaco/IOL phacoemulsification  with intraocular lens placement; [place] photorefractive keratectomy; LASIK laser assisted in situ keratomileusis; HTN hypertension; DM diabetes mellitus; COPD chronic obstructive pulmonary disease

## 2021-12-21 DIAGNOSIS — M9905 Segmental and somatic dysfunction of pelvic region: Secondary | ICD-10-CM | POA: Diagnosis not present

## 2021-12-21 DIAGNOSIS — M62830 Muscle spasm of back: Secondary | ICD-10-CM | POA: Diagnosis not present

## 2021-12-21 DIAGNOSIS — M9902 Segmental and somatic dysfunction of thoracic region: Secondary | ICD-10-CM | POA: Diagnosis not present

## 2021-12-21 DIAGNOSIS — M9903 Segmental and somatic dysfunction of lumbar region: Secondary | ICD-10-CM | POA: Diagnosis not present

## 2021-12-26 DIAGNOSIS — M9902 Segmental and somatic dysfunction of thoracic region: Secondary | ICD-10-CM | POA: Diagnosis not present

## 2021-12-26 DIAGNOSIS — M9903 Segmental and somatic dysfunction of lumbar region: Secondary | ICD-10-CM | POA: Diagnosis not present

## 2021-12-26 DIAGNOSIS — M62830 Muscle spasm of back: Secondary | ICD-10-CM | POA: Diagnosis not present

## 2021-12-26 DIAGNOSIS — M9905 Segmental and somatic dysfunction of pelvic region: Secondary | ICD-10-CM | POA: Diagnosis not present

## 2021-12-28 DIAGNOSIS — M9902 Segmental and somatic dysfunction of thoracic region: Secondary | ICD-10-CM | POA: Diagnosis not present

## 2021-12-28 DIAGNOSIS — M9903 Segmental and somatic dysfunction of lumbar region: Secondary | ICD-10-CM | POA: Diagnosis not present

## 2021-12-28 DIAGNOSIS — M62830 Muscle spasm of back: Secondary | ICD-10-CM | POA: Diagnosis not present

## 2021-12-28 DIAGNOSIS — M9905 Segmental and somatic dysfunction of pelvic region: Secondary | ICD-10-CM | POA: Diagnosis not present

## 2021-12-30 ENCOUNTER — Ambulatory Visit
Admission: RE | Admit: 2021-12-30 | Discharge: 2021-12-30 | Disposition: A | Discharge status: Home / Self Care | Payer: Medicare PPO | Source: Ambulatory Visit | Attending: Family Medicine | Admitting: Family Medicine

## 2021-12-30 ENCOUNTER — Ambulatory Visit: Payer: Medicare PPO

## 2021-12-30 DIAGNOSIS — Z1231 Encounter for screening mammogram for malignant neoplasm of breast: Secondary | ICD-10-CM

## 2022-01-02 ENCOUNTER — Ambulatory Visit (HOSPITAL_BASED_OUTPATIENT_CLINIC_OR_DEPARTMENT_OTHER): Payer: Medicare PPO | Admitting: Cardiovascular Disease

## 2022-01-02 ENCOUNTER — Telehealth: Payer: Self-pay | Admitting: *Deleted

## 2022-01-02 ENCOUNTER — Ambulatory Visit: Payer: Medicare PPO | Attending: Cardiovascular Disease

## 2022-01-02 ENCOUNTER — Encounter (HOSPITAL_BASED_OUTPATIENT_CLINIC_OR_DEPARTMENT_OTHER): Payer: Self-pay | Admitting: Cardiovascular Disease

## 2022-01-02 ENCOUNTER — Encounter (HOSPITAL_BASED_OUTPATIENT_CLINIC_OR_DEPARTMENT_OTHER): Payer: Medicare PPO | Admitting: Cardiology

## 2022-01-02 ENCOUNTER — Ambulatory Visit (INDEPENDENT_AMBULATORY_CARE_PROVIDER_SITE_OTHER): Payer: Medicare PPO

## 2022-01-02 VITALS — BP 122/64 | HR 100 | Ht 63.0 in | Wt 159.2 lb

## 2022-01-02 DIAGNOSIS — I4719 Other supraventricular tachycardia: Secondary | ICD-10-CM | POA: Insufficient documentation

## 2022-01-02 DIAGNOSIS — R0683 Snoring: Secondary | ICD-10-CM

## 2022-01-02 DIAGNOSIS — Z8679 Personal history of other diseases of the circulatory system: Secondary | ICD-10-CM

## 2022-01-02 DIAGNOSIS — R0681 Apnea, not elsewhere classified: Secondary | ICD-10-CM

## 2022-01-02 DIAGNOSIS — I10 Essential (primary) hypertension: Secondary | ICD-10-CM

## 2022-01-02 HISTORY — DX: Other supraventricular tachycardia: I47.19

## 2022-01-02 NOTE — Assessment & Plan Note (Signed)
She is tachycardic in the office today.  Overall symptoms have been minimal recently.  She is only symptomatic when she is on different medications.  She reports being very adversely affected by new medicines.  She does not think her heart rate is this fast at home.  We will have her wear a 48-hour monitor to better understand her average heart rate.  Echo several years ago and showed no evidence of heart failure and she still has no heart failure symptoms.  Continue to monitor for now.  Thyroid function has been normal.  She has not tolerated diltiazem or nadolol in the past.

## 2022-01-02 NOTE — Patient Instructions (Signed)
Medication Instructions:  Your physician recommends that you continue on your current medications as directed. Please refer to the Current Medication list given to you today.   *If you need a refill on your cardiac medications before your next appointment, please call your pharmacy*  Lab Work: NONE  Testing/Procedures: [employer] HOME SLEEP STUDY WILL CALL YOU WITH CODE ONCE INSURANCE HAS APPROVED  3 DAY MONITOR   Follow-Up: At [facility], you and your health needs are our priority.  As part of our continuing mission to provide you with exceptional heart care, we have created designated Provider Care Teams.  These Care Teams include your primary Cardiologist (physician) and Advanced Practice Providers (APPs -  Physician Assistants and Nurse Practitioners) who all work together to provide you with the care you need, when you need it.  We recommend signing up for the patient portal called "MyChart".  Sign up information is provided on this After Visit Summary.  MyChart is used to connect with patients for Virtual Visits (Telemedicine).  Patients are able to view lab/test results, encounter notes, upcoming appointments, etc.  Non-urgent messages can be sent to your provider as well.   To learn more about what you can do with MyChart, go to [email or web address].    Your next appointment:   2 month(s)  The format for your next appointment:   In Person  Provider:   Laurann Montana, NP   Other Instructions  WatchPAT?  Is a FDA cleared portable home sleep study test that uses a watch and 3 points of contact to monitor 7 different channels, including your heart rate, oxygen saturations, body position, snoring, and chest motion.  The study is easy to use from the comfort of your own home and accurately detect sleep apnea.  Before bed, you attach the chest sensor, attached the sleep apnea bracelet to your nondominant hand, and attach the finger probe.  After the study, the raw data is  downloaded from the watch and scored for apnea events.   For more information: [email or web address]  Patient Testing Instructions:  Do not put battery into the device until bedtime when you are ready to begin the test. Please call the support number if you need assistance after following the instructions below: 24 hour support line- [phone] or [facility] support at [phone] (option 2)  Download the [employer] One" app through the google play store or App Store  Be sure to turn on or enable access to bluetooth in [place] on your smartphone/ device  Make sure no other bluetooth devices are on and within the vicinity of your smartphone/ device and WatchPAT watch during testing.  Make sure to leave your smart phone/ device plugged in and charging all night.  When ready for bed:  Follow the instructions step by step in the WatchPAT One App to activate the testing device. For additional instructions, including video instruction, visit the WatchPAT One video on Youtube. You can search for [place] One within Youtube (video is 4 minutes and 18 seconds) or enter: https://youtube/watch?v=BCce_vbiwxE Please note: You will be prompted to enter a Pin to connect via bluetooth when starting the test. The PIN will be assigned to you when you receive the test.  The device is disposable, but it recommended that you retain the device until you receive a call letting you know the study has been received and the results have been interpreted.  We will let you know if the study did not transmit to [place] properly after the test is  completed. You do not need to call us to confirm the receipt of the test.  Please complete the test within 48 hours of receiving PIN.   Frequently Asked Questions:  What is Watch [place] one?  A single use fully disposable home sleep apnea testing device and will not need to be returned after completion.  What are the requirements to use WatchPAT one?  The be able  to have a successful watchpat one sleep study, you should have your Watch pat one device, your smart phone, watch pat one app, your PIN number and Internet access What type of phone do I need?  You should have a smart phone that uses Android 5.1 and above or any Iphone with IOS 10 and above How can I download the WatchPAT one app?  Based on your device type search for WatchPAT one app either in google play for android devices or APP store for Iphone's Where will I get my PIN for the study?  Your PIN will be provided by your physician's office. It is used for authentication and if you lose/forget your PIN, please reach out to your providers office.  I do not have Internet at home. Can I do WatchPAT one study?  WatchPAT One needs Internet connection throughout the night to be able to transmit the sleep data. You can use your home/local internet or your cellular's data package. However, it is always recommended to use home/local Internet. It is estimated that between 20MB-30MB will be used with each study.However, the application will be looking for 80MB space in the phone to start the study.  What happens if I lose internet or bluetooth connection?  During the internet disconnection, your phone will not be able to transmit the sleep data. All the data, will be stored in your phone. As soon as the internet connection is back on, the phone will being sending the sleep data. During the bluetooth disconnection, WatchPAT one will not be able to to send the sleep data to your phone. Data will be kept in the [employer] one until two devices have bluetooth connection back on. As soon as the connection is back on, WatchPAT one will send the sleep data to the phone.  How long do I need to wear the WatchPAT one?  After you start the study, you should wear the device at least 6 hours.  How far should I keep my phone from the device?  During the night, your phone should be within 15 feet.  What happens if I leave the  room for restroom or other reasons?  Leaving the room for any reason will not cause any problem. As soon as your get back to the room, both devices will reconnect and will continue to send the sleep data. Can I use my phone during the sleep study?  Yes, you can use your phone as usual during the study. But it is recommended to put your watchpat one on when you are ready to go to bed.  How will I get my study results?  A soon as you completed your study, your sleep data will be sent to the provider. They will then share the results with you when they are ready.

## 2022-01-02 NOTE — Progress Notes (Signed)
Cardiology Office Note:    Date:  01/02/2022   ID:  Pamela Lang, DOB September 02, 1950, MRN [id number]  PCP:  Jackson, Samantha J, [place] [facility] Cardiologist:  Skeet Latch, MD     Referring MD: Jake Samples, [place]*   No chief complaint on file.   History of Present Illness:    Pamela Lang is a 71 y.o. female with a hx of TIA, SVT, and palpitations here to establish care.  She saw her PCP for a wellness exam 09/2021 and was referred to cardiology for SVT. She was previously seen by cardiology at [facility]. She had an Echo 12/2018 which revealed LVEF 60-65% and trivial to mild mitral regurgitation. There was no mention of mitral valve prolapse. She has worn ambulatory monitors but the results are not available.   Today, she is accompanied by a family member. She recounts that she had a TIA in 2012 while at work. She endorses that over the years, almost every time she has been put on medication, it causes her heart to become arrhythmic. Whenever she would stop her medication, her heart would go in rhythm again. She states that occasionally she has short sharp chest pains when she is walking or even lying in bed. These symptoms resolve quickly. She endorses her at home blood pressures are under 100. Her at home blood pressures are usually around what it was in clinic today (122/64). Of note, she mentions that she experiences some burning back pain. For exercise, she would walk around 2 miles per day and would feel good while doing so. She has not been walking as much lately due to traveling and joint pain. She drinks one cup of coffee per day and does not drink sodas. She does not smoke or drink alcohol. She endorses snoring at night but feels well-rested in the morning. Her mother had mini strokes but lived to be around 21 years old. Her father had COPD and heart disease. She denies any shortness of breath or peripheral edema. No lightheadedness, headaches,  syncope, orthopnea, or PND.  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   MVP (mitral valve prolapse)    no problems   Palpitations    EKG 12/24/18 and ECHO 01/22/19 normal in [place]  - rx Diltiazem cd 120 mg qd but has not started until after 02/06/19 procedure with  Coralyn Pear   PONV (postoperative nausea and vomiting)    SVT (supraventricular tachycardia)    TIA (transient ischemic attack) 2012   TIA    Past Surgical History:  Procedure Laterality Date   25 GAUGE PARS PLANA VITRECTOMY WITH 20 GAUGE MVR PORT FOR MACULAR HOLE Left 02/06/2019   Procedure: 25 GAUGE PARS PLANA VITRECTOMY WITH 20 GAUGE MVR PORT FOR MACULAR HOLE;  Surgeon: Bernarda Caffey, MD;  Location: [place];  Service: Ophthalmology;  Laterality: Left;   BREAST CYST ASPIRATION Right    CATARACT EXTRACTION Bilateral 2016   Dr. Herbert Deaner   CESAREAN SECTION     x 2   CHOLECYSTECTOMY     COLONOSCOPY     Cyst B/L Wrist Bilateral    GAS/FLUID EXCHANGE Left 02/06/2019   Procedure: Gas/Fluid Exchange;  Surgeon: Bernarda Caffey, MD;  Location: [place] [facility];  Service: Ophthalmology;  Laterality: Left;  [id number]   PHOTOCOAGULATION WITH LASER Left 02/06/2019   Procedure: Photocoagulation With Laser;  Surgeon: Bernarda Caffey, MD;  Location: [place];  Service: Ophthalmology;  Laterality: Left;   TOOTH EXTRACTION Bilateral    wisdom toothe ext   TUBAL LIGATION      Current Medications: Current Meds  Medication Sig   famotidine (PEPCID) 20 MG tablet Take 20 mg by mouth as needed for heartburn or indigestion.     Allergies:   Ceclor [cefaclor], Cortisone, Propranolol, Aspirin, Augmentin [amoxicillin-pot clavulanate], Ciprodex [ciprofloxacin-dexamethasone], Codeine, Darvocet [propoxyphene n-acetaminophen], Diazepam, Doxycycline, Omnaris  [ciclesonide], Other, Sulfa antibiotics, Sumatriptan, Zofran [ondansetron hcl], and Zoloft [sertraline hcl]   Social History   Socioeconomic History   Marital status: Married    Spouse name: Not on file   Number of children: Not on file   Years of education: Not on file   Highest education level: Not on file  Occupational History   Not on file  Tobacco Use   Smoking status: Former    Types: Cigarettes   Smokeless tobacco: Former   Tobacco comments:    quit 30 yrs ago per patient 02/06/19  Vaping Use   Vaping Use: Never used  Substance and Sexual Activity   Alcohol use: No   Drug use: No   Sexual activity: Not on file    Comment: Tubal   Other Topics Concern   Not on file  Social History Narrative   Not on file   Social Determinants of Health   Financial Resource Strain: Not on file  Food Insecurity: Not on file  Transportation Needs: Not on file  Physical Activity: Not on file  Stress: Not on file  Social Connections: Not on file     Family History: The patient's family history includes COPD in her father; Diabetes in her sister and sister; Heart disease in her father; Transient ischemic attack in her mother.  ROS:   Please see the history of present illness.    (+) Back pain (+) Stress  (+) Occasional sharp chest pains All other systems reviewed and are negative.  EKGs/Labs/Other Studies Reviewed:    The following studies were reviewed today: No prior cardiovascular studies.  EKG: EKG is personally reviewed. 01/02/22: Multifocal ventricular tachycardia. Rate 110 bpm.  Recent Labs: No results found for requested labs within last 365 days.  Recent Lipid Panel No results found for: "CHOL", "TRIG", "HDL", "CHOLHDL", "VLDL", "LDLCALC", "LDLDIRECT"   Risk Assessment/Calculations:           STOP-Bang Score:  4       Physical Exam:    VS:  BP 122/64   Pulse 100   Ht '5\' 3"'$  (1.6 m)   Wt 159 lb 3.2 oz (72.2 kg)   LMP  (LMP Unknown)   SpO2 97%   BMI  28.20 kg/m     Wt Readings from Last 3 Encounters:  01/02/22 159 lb 3.2 oz (72.2 kg)  02/06/19 159 lb (72.1 kg)  01/20/13 152 lb (68.9 kg)    VS:  BP 122/64   Pulse 100   Ht '5\' 3"'$  (1.6 m)   Wt 159 lb 3.2 oz (72.2 kg)   LMP  (LMP Unknown)   SpO2 97%   BMI 28.20 kg/m  , BMI Body mass index is 28.2 kg/m. GENERAL:  Well appearing HEENT: Pupils equal round and reactive, fundi not visualized, oral mucosa unremarkable NECK:  No jugular venous distention, waveform within  normal limits, carotid upstroke brisk and symmetric, no bruits, no thyromegaly LUNGS:  Clear to auscultation bilaterally HEART:  RRR.  PMI not displaced or sustained,S1 and S2 within normal limits, no S3, no S4, no clicks, no rubs, no murmurs ABD:  Flat, positive bowel sounds normal in frequency in pitch, no bruits, no rebound, no guarding, no midline pulsatile mass, no hepatomegaly, no splenomegaly EXT:  2 plus pulses throughout, no edema, no cyanosis no clubbing SKIN:  No rashes no nodules NEURO:  Cranial nerves II through XII grossly intact, motor grossly intact throughout PSYCH:  Cognitively intact, oriented to person place and time    ASSESSMENT:    1. H/O mitral valve prolapse   2. Multifocal atrial tachycardia   3. Snoring   4. Apnea     PLAN:    H/O mitral valve prolapse Not noted on echo last year.  Multifocal atrial tachycardia She is tachycardic in the office today.  Overall symptoms have been minimal recently.  She is only symptomatic when she is on different medications.  She reports being very adversely affected by new medicines.  She does not think her heart rate is this fast at home.  We will have her wear a 48-hour monitor to better understand her average heart rate.  Echo several years ago and showed no evidence of heart failure and she still has no heart failure symptoms.  Continue to monitor for now.  Thyroid function has been normal.  She has not tolerated diltiazem or nadolol in the  past.   Disposition: FU with APP in 2-3 months.  Medication Adjustments/Labs and Tests Ordered: Current medicines are reviewed at length with the patient today.  Concerns regarding medicines are outlined above.  Orders Placed This Encounter  Procedures   LONG TERM MONITOR (3-14 DAYS)   EKG 12-Lead   It[facility] Sleep Study   No orders of the defined types were placed in this encounter.   Patient Instructions  Medication Instructions:  Your physician recommends that you continue on your current medications as directed. Please refer to the Current Medication list given to you today.   *If you need a refill on your cardiac medications before your next appointment, please call your pharmacy*  Lab Work: NONE  Testing/Procedures: [employer] HOME SLEEP STUDY WILL CALL YOU WITH CODE ONCE INSURANCE HAS APPROVED  3 DAY MONITOR   Follow-Up: At [facility], you and your health needs are our priority.  As part of our continuing mission to provide you with exceptional heart care, we have created designated Provider Care Teams.  These Care Teams include your primary Cardiologist (physician) and Advanced Practice Providers (APPs -  Physician Assistants and Nurse Practitioners) who all work together to provide you with the care you need, when you need it.  We recommend signing up for the patient portal called "MyChart".  Sign up information is provided on this After Visit Summary.  MyChart is used to connect with patients for Virtual Visits (Telemedicine).  Patients are able to view lab/test results, encounter notes, upcoming appointments, etc.  Non-urgent messages can be sent to your provider as well.   To learn more about what you can do with MyChart, go to [email or web address].    Your next appointment:   2 month(s)  The format for your next appointment:   In Person  Provider:   Laurann Montana, NP   Other Instructions  WatchPAT?  Is a FDA cleared portable home sleep study test  that uses a watch and  3 points of contact to monitor 7 different channels, including your heart rate, oxygen saturations, body position, snoring, and chest motion.  The study is easy to use from the comfort of your own home and accurately detect sleep apnea.  Before bed, you attach the chest sensor, attached the sleep apnea bracelet to your nondominant hand, and attach the finger probe.  After the study, the raw data is downloaded from the watch and scored for apnea events.   For more information: [email or web address]  Patient Testing Instructions:  Do not put battery into the device until bedtime when you are ready to begin the test. Please call the support number if you need assistance after following the instructions below: 24 hour support line- [phone] or ITAMAR support at [phone] (option 2)  Download the [employer]WatchPAT One" app through the google play store or App Store  Be sure to turn on or enable access to bluetooth in [place] on your smartphone/ device  Make sure no other bluetooth devices are on and within the vicinity of your smartphone/ device and WatchPAT watch during testing.  Make sure to leave your smart phone/ device plugged in and charging all night.  When ready for bed:  Follow the instructions step by step in the WatchPAT One App to activate the testing device. For additional instructions, including video instruction, visit the WatchPAT One video on Youtube. You can search for [place] One within Youtube (video is 4 minutes and 18 seconds) or enter: https://youtube/watch?v=BCce_vbiwxE Please note: You will be prompted to enter a Pin to connect via bluetooth when starting the test. The PIN will be assigned to you when you receive the test.  The device is disposable, but it recommended that you retain the device until you receive a call letting you know the study has been received and the results have been interpreted.  We will let you know if the  study did not transmit to [place] properly after the test is completed. You do not need to call us to confirm the receipt of the test.  Please complete the test within 48 hours of receiving PIN.   Frequently Asked Questions:  What is Watch [place] one?  A single use fully disposable home sleep apnea testing device and will not need to be returned after completion.  What are the requirements to use WatchPAT one?  The be able to have a successful watchpat one sleep study, you should have your Watch pat one device, your smart phone, watch pat one app, your PIN number and Internet access What type of phone do I need?  You should have a smart phone that uses Android 5.1 and above or any Iphone with IOS 10 and above How can I download the WatchPAT one app?  Based on your device type search for WatchPAT one app either in google play for android devices or APP store for Iphone's Where will I get my PIN for the study?  Your PIN will be provided by your physician's office. It is used for authentication and if you lose/forget your PIN, please reach out to your providers office.  I do not have Internet at home. Can I do WatchPAT one study?  WatchPAT One needs Internet connection throughout the night to be able to transmit the sleep data. You can use your home/local internet or your cellular's data package. However, it is always recommended to use home/local Internet. It is estimated that between 20MB-30MB will be used with each study.However, the application will be looking  for 80MB space in the phone to start the study.  What happens if I lose internet or bluetooth connection?  During the internet disconnection, your phone will not be able to transmit the sleep data. All the data, will be stored in your phone. As soon as the internet connection is back on, the phone will being sending the sleep data. During the bluetooth disconnection, WatchPAT one will not be able to to send the sleep data to your phone. Data will be  kept in the [facility] one until two devices have bluetooth connection back on. As soon as the connection is back on, WatchPAT one will send the sleep data to the phone.  How long do I need to wear the WatchPAT one?  After you start the study, you should wear the device at least 6 hours.  How far should I keep my phone from the device?  During the night, your phone should be within 15 feet.  What happens if I leave the room for restroom or other reasons?  Leaving the room for any reason will not cause any problem. As soon as your get back to the room, both devices will reconnect and will continue to send the sleep data. Can I use my phone during the sleep study?  Yes, you can use your phone as usual during the study. But it is recommended to put your watchpat one on when you are ready to go to bed.  How will I get my study results?  A soon as you completed your study, your sleep data will be sent to the provider. They will then share the results with you when they are ready.        I,Breanna Adamick,acting as a scribe for Skeet Latch, MD.,have documented all relevant documentation on the behalf of Skeet Latch, MD,as directed by  Skeet Latch, MD while in the presence of Skeet Latch, MD.  I, [place] Linsey, MD have reviewed all documentation for this visit.  The documentation of the exam, diagnosis, procedures, and orders on 01/02/2022 are all accurate and complete.   Signed, Skeet Latch, MD  01/02/2022 5:17 PM    [place]

## 2022-01-02 NOTE — Assessment & Plan Note (Signed)
Not noted on echo last year.

## 2022-01-02 NOTE — Telephone Encounter (Signed)
Notified Alvina Filbert ok to activate itamar device.

## 2022-01-03 ENCOUNTER — Ambulatory Visit (INDEPENDENT_AMBULATORY_CARE_PROVIDER_SITE_OTHER): Payer: Medicare PPO | Admitting: Ophthalmology

## 2022-01-03 ENCOUNTER — Telehealth: Payer: Self-pay | Admitting: Cardiovascular Disease

## 2022-01-03 DIAGNOSIS — D3132 Benign neoplasm of left choroid: Secondary | ICD-10-CM | POA: Diagnosis not present

## 2022-01-03 DIAGNOSIS — Z961 Presence of intraocular lens: Secondary | ICD-10-CM | POA: Diagnosis not present

## 2022-01-03 DIAGNOSIS — H35342 Macular cyst, hole, or pseudohole, left eye: Secondary | ICD-10-CM

## 2022-01-03 DIAGNOSIS — H26492 Other secondary cataract, left eye: Secondary | ICD-10-CM | POA: Diagnosis not present

## 2022-01-03 DIAGNOSIS — H35372 Puckering of macula, left eye: Secondary | ICD-10-CM

## 2022-01-03 NOTE — Telephone Encounter (Signed)
  Pt is calling to get clarification for her sleep study. She said to call her back on her cell phone number [phone]

## 2022-01-03 NOTE — Telephone Encounter (Signed)
Spoke with patient and advised sleep study had been received and someone would reach out once reviewed  Per Lovena Le results viewable in [facility] system   Confirmed she is still wearing 3 day monitor

## 2022-01-03 NOTE — Procedures (Signed)
       SLEEP STUDY REPORT Patient Information Study Date: 01/02/22 Patient Name: Pamela Lang Patient ID: [id number] Birth Date: 2050-06-07 Age: 71 Gender: Female BMI: 28.1 (W=159 lb, H=5' 3'') Referring Physician: Skeet Latch, MD  TEST DESCRIPTION: Home sleep apnea testing was completed using the WatchPat, a Type 1 device, utilizing peripheral arterial tonometry (PAT), chest movement, actigraphy, pulse oximetry, pulse rate, body position and snore. AHI was calculated with apnea and hypopnea using valid sleep time as the denominator. RDI includes apneas, hypopneas, and RERAs. The data acquired and the scoring of sleep and all associated events were performed in accordance with the recommended standards and specifications as outlined in the AASM Manual for the Scoring of Sleep and Associated Events 2.2.0 (2015).   FINDINGS:   1.  No evidence of Obstructive Sleep Apnea with AHI 3.6/hr.    2.  No Central Sleep Apnea.   3.  Oxygen desaturations as low as 86%.   4.  Mild snoring was present. O2 sats were < 88% for 0.4 minutes.   5.  Total sleep time was 7 hrs and 59 min.   6.  27.8% of total sleep time was spent in REM sleep.    7.  Normal sleep onset latency at 19 min.    8.  Shortened REM sleep onset latency at 32 min.    9.  Total awakenings were 7 10.  Arrhythmia detection demonstrated possible atrial fibrillation lasting 2 min and 11 sec.  This is not diagnostic of atrial fibrillation and is only a screening tool.    DIAGNOSIS:  Normal study with no significant sleep disordered breathing.  RECOMMENDATIONS:   1. Normal study with no significant sleep disordered breathing.  2.  Healthy sleep recommendations include:  adequate nightly sleep (normal 7-9 hrs/night), avoidance of caffeine after noon and alcohol near bedtime, and maintaining a sleep environment that is cool, dark and quiet.  3.  Weight loss for overweight patients is recommended.    4.  Snoring recommendations  include:  weight loss where appropriate, side sleeping, and avoidance of alcohol before bed.  5.  Operation of motor vehicle or dangerous equipment must be avoided when feeling drowsy, excessively sleepy, or mentally fatigued.    6.  An ENT consultation which may be useful for specific causes of and possible treatment of bothersome snoring.   7. Weight loss may be of benefit in reducing the severity of snoring.   8.  Consider event monitor to assess for silent atrial arrhythmias if clinically indicated. Signature:    Fransico Him, MD; [facility]; [place], [place] Board of Sleep Medicine Electronically Signed: 01/03/22

## 2022-01-04 ENCOUNTER — Encounter (INDEPENDENT_AMBULATORY_CARE_PROVIDER_SITE_OTHER): Payer: Self-pay | Admitting: Ophthalmology

## 2022-01-04 ENCOUNTER — Other Ambulatory Visit: Payer: Self-pay | Admitting: Family Medicine

## 2022-01-04 DIAGNOSIS — R928 Other abnormal and inconclusive findings on diagnostic imaging of breast: Secondary | ICD-10-CM

## 2022-01-06 DIAGNOSIS — Z1231 Encounter for screening mammogram for malignant neoplasm of breast: Secondary | ICD-10-CM | POA: Diagnosis not present

## 2022-01-06 DIAGNOSIS — Z6827 Body mass index (BMI) 27.0-27.9, adult: Secondary | ICD-10-CM | POA: Diagnosis not present

## 2022-01-06 DIAGNOSIS — E663 Overweight: Secondary | ICD-10-CM | POA: Diagnosis not present

## 2022-01-06 DIAGNOSIS — F419 Anxiety disorder, unspecified: Secondary | ICD-10-CM | POA: Diagnosis not present

## 2022-01-09 ENCOUNTER — Telehealth: Payer: Self-pay | Admitting: *Deleted

## 2022-01-09 NOTE — Telephone Encounter (Signed)
Left message to return a call to discuss sleep study results. 

## 2022-01-09 NOTE — Telephone Encounter (Signed)
-----   Message from Sueanne Margarita, MD sent at 01/03/2022 11:57 AM EST ----- Please let patient know that sleep study showed no significant sleep apnea.

## 2022-01-09 NOTE — Telephone Encounter (Signed)
Patient returned a call and was given sleep study results.

## 2022-01-10 ENCOUNTER — Ambulatory Visit
Admission: RE | Admit: 2022-01-10 | Discharge: 2022-01-10 | Disposition: A | Discharge status: Home / Self Care | Payer: Medicare PPO | Source: Ambulatory Visit | Attending: Family Medicine | Admitting: Family Medicine

## 2022-01-10 DIAGNOSIS — R921 Mammographic calcification found on diagnostic imaging of breast: Secondary | ICD-10-CM | POA: Diagnosis not present

## 2022-01-10 DIAGNOSIS — I4719 Other supraventricular tachycardia: Secondary | ICD-10-CM | POA: Diagnosis not present

## 2022-01-10 DIAGNOSIS — R928 Other abnormal and inconclusive findings on diagnostic imaging of breast: Secondary | ICD-10-CM | POA: Diagnosis not present

## 2022-01-17 ENCOUNTER — Telehealth: Payer: Self-pay | Admitting: Cardiovascular Disease

## 2022-01-17 NOTE — Telephone Encounter (Signed)
Please advise on Monitor results

## 2022-01-17 NOTE — Telephone Encounter (Signed)
Pt calling to f/u on Heart Monitor results. Please advise 

## 2022-01-26 NOTE — Telephone Encounter (Signed)
Released with comments 11/22 and viewed by patient

## 2022-02-15 DIAGNOSIS — D225 Melanocytic nevi of trunk: Secondary | ICD-10-CM | POA: Diagnosis not present

## 2022-02-15 DIAGNOSIS — D485 Neoplasm of uncertain behavior of skin: Secondary | ICD-10-CM | POA: Diagnosis not present

## 2022-02-15 DIAGNOSIS — Z1283 Encounter for screening for malignant neoplasm of skin: Secondary | ICD-10-CM | POA: Diagnosis not present

## 2022-03-01 DIAGNOSIS — L258 Unspecified contact dermatitis due to other agents: Secondary | ICD-10-CM | POA: Diagnosis not present

## 2022-03-06 ENCOUNTER — Ambulatory Visit (HOSPITAL_BASED_OUTPATIENT_CLINIC_OR_DEPARTMENT_OTHER): Payer: Medicare PPO | Admitting: Family

## 2022-03-06 ENCOUNTER — Encounter (HOSPITAL_BASED_OUTPATIENT_CLINIC_OR_DEPARTMENT_OTHER): Payer: Self-pay | Admitting: Family

## 2022-03-06 VITALS — BP 108/68 | HR 64 | Ht 63.0 in | Wt 156.0 lb

## 2022-03-06 DIAGNOSIS — I491 Atrial premature depolarization: Secondary | ICD-10-CM | POA: Diagnosis not present

## 2022-03-06 DIAGNOSIS — Z8679 Personal history of other diseases of the circulatory system: Secondary | ICD-10-CM

## 2022-03-06 DIAGNOSIS — I4719 Other supraventricular tachycardia: Secondary | ICD-10-CM | POA: Diagnosis not present

## 2022-03-06 NOTE — Patient Instructions (Addendum)
Medication Instructions:  No medication changes today.   *If you need a refill on your cardiac medications before your next appointment, please call your pharmacy*   Lab Work/Testing/Procedures: Monitor showed some fast heart rates from the top chamber of the heart (called premature atrial contractions or PAC's) that are frequent but no atrial fibrillation.  This can be very annoying, but it isn't dangerous.   Your sleep study showed no sleep apnea which is great!  Follow-Up: At [facility], you and your health needs are our priority.  As part of our continuing mission to provide you with exceptional heart care, we have created designated Provider Care Teams.  These Care Teams include your primary Cardiologist (physician) and Advanced Practice Providers (APPs -  Physician Assistants and Nurse Practitioners) who all work together to provide you with the care you need, when you need it.  We recommend signing up for the patient portal called "MyChart".  Sign up information is provided on this After Visit Summary.  MyChart is used to connect with patients for Virtual Visits (Telemedicine).  Patients are able to view lab/test results, encounter notes, upcoming appointments, etc.  Non-urgent messages can be sent to your provider as well.   To learn more about what you can do with MyChart, go to [email or web address].    Your next appointment:   1 year(s)  The format for your next appointment:   In Person  Provider:   Skeet Latch, MD    Other Instructions   To prevent palpitations: Make sure you are adequately hydrated.  Avoid and/or limit caffeine containing beverages like soda or tea. Exercise regularly.  Manage stress well. Some over the counter medications can cause palpitations such as Benadryl, AdvilPM, TylenolPM. Regular Advil or Tylenol do not cause palpitations.

## 2022-03-06 NOTE — Progress Notes (Signed)
Office Visit    Patient Name: Pamela Lang Date of Encounter: 03/06/2022  PCP:  Jackson, Samantha J, [place]  Cardiologist:  Skeet Latch, MD  Advanced Practice Provider:  No care team member to display Electrophysiologist:  None      Chief Complaint    Pamela Lang is a 72 y.o. female presents today for follow up after monitor.   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   MVP (mitral valve prolapse)    no problems   Palpitations    EKG 12/24/18 and ECHO 01/22/19 normal in [facility]  - rx Diltiazem cd 120 mg qd but has not started until after 02/06/19 procedure with  Coralyn Pear   PONV (postoperative nausea and vomiting)    SVT (supraventricular tachycardia)    TIA (transient ischemic attack) 2012   TIA   Past Surgical History:  Procedure Laterality Date   25 GAUGE PARS PLANA VITRECTOMY WITH 20 GAUGE MVR PORT FOR MACULAR HOLE Left 02/06/2019   Procedure: 25 GAUGE PARS PLANA VITRECTOMY WITH 20 GAUGE MVR PORT FOR MACULAR HOLE;  Surgeon: Bernarda Caffey, MD;  Location: [facility];  Service: Ophthalmology;  Laterality: Left;   BREAST CYST ASPIRATION Right    CATARACT EXTRACTION Bilateral 2016   Dr. Herbert Deaner   CESAREAN SECTION     x 2   CHOLECYSTECTOMY     COLONOSCOPY     Cyst B/L Wrist Bilateral    GAS/FLUID EXCHANGE Left 02/06/2019   Procedure: Gas/Fluid Exchange;  Surgeon: Bernarda Caffey, MD;  Location: [facility];  Service: Ophthalmology;  Laterality: Left;  C3F8   PHOTOCOAGULATION WITH LASER Left 02/06/2019   Procedure: Photocoagulation With Laser;  Surgeon: Bernarda Caffey, MD;  Location: [facility];  Service: Ophthalmology;  Laterality: Left;   TOOTH EXTRACTION Bilateral    wisdom toothe ext   TUBAL LIGATION       Allergies  Allergies  Allergen Reactions   Ceclor [Cefaclor] Nausea And Vomiting and Other (See Comments)    Severe stomach cramps   Cortisone Other (See Comments)    TIA   Propranolol Other (See Comments)    Decreased heart rate   Aspirin Other (See Comments)    Irritates ulcers    Augmentin [Amoxicillin-Pot Clavulanate] Nausea And Vomiting   Ciprodex [Ciprofloxacin-Dexamethasone]     Migraines   Codeine Nausea And Vomiting   Darvocet [Propoxyphene N-Acetaminophen] Nausea And Vomiting   Diazepam     Tachycardia    Doxycycline Nausea And Vomiting   Omnaris [Ciclesonide]     Migraines    Other     ALL STEROIDS CAUSE TACHYCARDIA    Sulfa Antibiotics Other (See Comments)    Migraine headache   Sumatriptan Other (See Comments)    Tachycardia    Zofran [Ondansetron Hcl]     Severe Migraines, Tachycardia    Zoloft [Sertraline Hcl]     Tachycardia     History of Present Illness    Pamela Lang is a 72 y.o. female with a hx of TIA, SVT, PAC, palpitations last seen 01/02/22 by Dr. Oval Linsey.  Prior TIA in 2012 while at work. Previously seen by [facility] cardiology. Echo  12/2018 normal LVEF 60-65%, trivial to mild MR. Reported history of MVP.   Established 01/02/22 noting every time she was on medications she became arrhythmic. Blood pressure were well controlled and walking for exercise. Due to multifocal atrial tachycardia 48 hour monitor as well as sleep study ordered. Sleep study with no sleep apnea. ZIO with predominantly NSR, 12.3% PAC burden, and no significant arrhythmia.   She presents today for follow up today with no complaints. She is active and walks daily. She eats healthy and monitors her blood pressure daily keeping a log with readings 90s-110s/60s. She denies any dizziness, headaches, SOB, nor chest pain. She takes tylenol PM PRN as she sometimes has difficulty sleeping.   EKGs/Labs/Other Studies Reviewed:   The following studies were reviewed  today:  EKG:  EKG is not ordered today.   Recent Labs: No results found for requested labs within last 365 days.  Recent Lipid Panel No results found for: "CHOL", "TRIG", "HDL", "CHOLHDL", "VLDL", "LDLCALC", "LDLDIRECT"  Home Medications   Current Meds  Medication Sig   ALPRAZolam (XANAX) 0.25 MG tablet Take 0.25-0.5 mg by mouth every 6 (six) hours as needed for anxiety.   famotidine (PEPCID) 20 MG tablet Take 20 mg by mouth as needed for heartburn or indigestion.     Review of Systems      All other systems reviewed and are otherwise negative except as noted above.  Physical Exam    VS:  BP 108/68   Pulse 64   Ht '5\' 3"'$  (1.6 m)   Wt 156 lb (70.8 kg)   LMP  (LMP Unknown)   BMI 27.63 kg/m  , BMI Body mass index is 27.63 kg/m.  Wt Readings from Last 3 Encounters:  03/06/22 156 lb (70.8 kg)  01/02/22 159 lb 3.2 oz (72.2 kg)  02/06/19 159 lb (72.1 kg)    GEN: Well nourished, well developed, in no acute distress. HEENT: normal. Neck: Supple, no JVD, carotid bruits, or masses. Cardiac: RRR, no murmurs, rubs, or gallops. No clubbing, cyanosis, edema.   Radials/PT 2+ and equal bilaterally.  Respiratory:  Respirations regular and unlabored, clear to auscultation bilaterally. GI: Soft, nontender, nondistended. MS: No deformity or atrophy. Skin: Warm and dry, no rash. Neuro:  Strength and sensation are intact. Psych: Normal affect.  Assessment & Plan    Atrial tachycardia / PAC - Palpitations well controlled, no atrial fib noted by 3 day ZIO. ZIO did show 12% PAC burden. Previously did not tolerate Diltaizem, Nadolol. Defer medical therapy. Encouraged to avoid palpitations by avoiding caffeine, exercising regularly, staying well hydrated, managing stress well.  H/o MVP - Not noted on echo 12/2018.        Disposition: Follow up 1 year with Skeet Latch, MD or APP.  Signed, Loel Dubonnet, NP 03/06/2022, 10:50 AM [place]

## 2022-09-11 ENCOUNTER — Emergency Department (HOSPITAL_COMMUNITY): Payer: Medicare PPO

## 2022-09-11 ENCOUNTER — Emergency Department (HOSPITAL_COMMUNITY)
Admission: EM | Admit: 2022-09-11 | Discharge: 2022-09-11 | Disposition: A | Discharge status: Home / Self Care | Payer: Medicare PPO | Attending: Emergency Medicine | Admitting: Emergency Medicine

## 2022-09-11 ENCOUNTER — Other Ambulatory Visit: Payer: Self-pay

## 2022-09-11 ENCOUNTER — Encounter (HOSPITAL_COMMUNITY): Payer: Self-pay | Admitting: Emergency Medicine

## 2022-09-11 DIAGNOSIS — S20221A Contusion of right back wall of thorax, initial encounter: Secondary | ICD-10-CM | POA: Diagnosis not present

## 2022-09-11 DIAGNOSIS — S299XXA Unspecified injury of thorax, initial encounter: Secondary | ICD-10-CM | POA: Diagnosis present

## 2022-09-11 DIAGNOSIS — R0781 Pleurodynia: Secondary | ICD-10-CM

## 2022-09-11 DIAGNOSIS — W19XXXA Unspecified fall, initial encounter: Secondary | ICD-10-CM | POA: Insufficient documentation

## 2022-09-11 DIAGNOSIS — R059 Cough, unspecified: Secondary | ICD-10-CM | POA: Diagnosis not present

## 2022-09-11 MED ORDER — LIDOCAINE 5 % EX PTCH
1.0000 | MEDICATED_PATCH | CUTANEOUS | Status: DC
  Administered 2022-09-11: 1 via TRANSDERMAL
  Filled 2022-09-11: qty 1

## 2022-09-11 MED ORDER — LIDOCAINE 5 % EX PTCH: 1.0000 | MEDICATED_PATCH | CUTANEOUS | 0 refills | Status: AC

## 2022-09-11 NOTE — ED Provider Notes (Signed)
[place] EMERGENCY DEPARTMENT AT [facility] Provider Note   CSN: [id number] Arrival date & time: 09/11/22  8295     History  Chief Complaint  Patient presents with   Increased rib pain    Pamela Lang is a 72 y.o. female.  Patient is a 72 year old female with past medical history of osteoporosis presenting for right-sided rib pain.  She states 7 days ago she fell and injured her right ribs with bruising and skin abrasion.  States her pain was mild and tolerable.  States her and her husband were wrapping her ribs with an Ace wrap and a bind so that she could sleep.  States she coughed this morning while her ribs were wrapped and had severe pain.  Pain is worse with movement and palpation at this time.  Skin wound is healing.  The history is provided by the patient. No language interpreter was used.       Home Medications Prior to Admission medications   Medication Sig Start Date End Date Taking? Authorizing Provider  lidocaine (LIDODERM) 5 % Place 1 patch onto the skin daily. Remove & Discard patch within 12 hours or as directed by MD 09/11/22  Yes Edwin Dada P, DO  ALPRAZolam Prudy Feeler) 0.25 MG tablet Take 0.25-0.5 mg by mouth every 6 (six) hours as needed for anxiety.    [provider]  famotidine (PEPCID) 20 MG tablet Take 20 mg by mouth as needed for heartburn or indigestion.    [provider]      Allergies    Ceclor [cefaclor], Cortisone, Propranolol, Aspirin, Augmentin [amoxicillin-pot clavulanate], Ciprodex [ciprofloxacin-dexamethasone], Codeine, Darvocet [propoxyphene n-acetaminophen], Diazepam, Doxycycline, Omnaris [ciclesonide], Other, Sulfa antibiotics, Sumatriptan, Zofran [ondansetron hcl], and Zoloft [sertraline hcl]    Review of Systems   Review of Systems  Constitutional:  Negative for chills and fever.  HENT:  Negative for ear pain and sore throat.   Eyes:  Negative for pain and visual disturbance.  Respiratory:  Negative for  cough and shortness of breath.   Cardiovascular:  Negative for chest pain and palpitations.  Gastrointestinal:  Negative for abdominal pain and vomiting.  Genitourinary:  Negative for dysuria and hematuria.  Musculoskeletal:  Negative for arthralgias and back pain.  Skin:  Negative for color change and rash.  Neurological:  Negative for seizures and syncope.  All other systems reviewed and are negative.   Physical Exam Updated Vital Signs BP (!) 140/66 (BP Location: Left Arm)   Pulse 89   Temp 97.8 F (36.6 C) (Oral)   Resp 18   Ht 5\' 3"  (1.6 m)   Wt 71.7 kg   LMP  (LMP Unknown)   SpO2 96%   BMI 27.99 kg/m  Physical Exam Vitals and nursing note reviewed.  Constitutional:      General: She is not in acute distress.    Appearance: She is well-developed.  HENT:     Head: Normocephalic and atraumatic.  Eyes:     Conjunctiva/sclera: Conjunctivae normal.  Cardiovascular:     Rate and Rhythm: Normal rate and regular rhythm.     Heart sounds: No murmur heard. Pulmonary:     Effort: Pulmonary effort is normal. No respiratory distress.     Breath sounds: Normal breath sounds.  Chest:    Abdominal:     Palpations: Abdomen is soft.     Tenderness: There is no abdominal tenderness.  Musculoskeletal:        General: No swelling.     Cervical  back: Neck supple. No bony tenderness.     Thoracic back: No bony tenderness.     Lumbar back: No bony tenderness.       Back:  Skin:    General: Skin is warm and dry.     Capillary Refill: Capillary refill takes less than 2 seconds.  Neurological:     Mental Status: She is alert.  Psychiatric:        Mood and Affect: Mood normal.     ED Results / Procedures / Treatments   Labs (all labs ordered are listed, but only abnormal results are displayed) Labs Reviewed - No data to display  EKG None  Radiology DG Chest 2 View  Result Date: 09/11/2022 CLINICAL DATA:  Right-sided rib pain starting today after coughing. EXAM: CHEST  - 2 VIEW COMPARISON:  No comparison studies available. FINDINGS: The lungs are clear without focal pneumonia, edema, pneumothorax or pleural effusion. The cardiopericardial silhouette is within normal limits for size. No acute bony abnormality. IMPRESSION: No active cardiopulmonary disease. Electronically Signed   By: Kennith Center M.D.   On: 09/11/2022 06:59    Procedures Procedures    Medications Ordered in ED Medications  lidocaine (LIDODERM) 5 % 1 patch (has no administration in time range)    ED Course/ Medical Decision Making/ A&P                             Medical Decision Making Risk Prescription drug management.    72 year old female with past medical history of osteoporosis presenting for right-sided rib pain.  Is alert and orient x 3, no acute distress, afebrile, stable vital signs.  Physical exam demonstrates granulation tissue over rib 7 and 8.  Improving bruises as compared to photos provided from last week.  Significant tenderness to palpation of the posterior ribs.  Chest x-ray ordered by triage team demonstrates no fractures.  Patient offered CT imaging and declined.  Patient offered narcotics, Motrin, Tylenol and declined.  Patient states most pain medications trigger her SVT.  States Motrin and Tylenol worsen her IBS.  Lidocaine patch applied.  Spirometry provided.    Patient in no distress and overall condition improved here in the ED. Detailed discussions were had with the patient regarding current findings, and need for close f/u with PCP or on call doctor. The patient has been instructed to return immediately if the symptoms worsen in any way for re-evaluation. Patient verbalized understanding and is in agreement with current care plan. All questions answered prior to discharge.         Final Clinical Impression(s) / ED Diagnoses Final diagnoses:  Rib pain    Rx / DC Orders ED Discharge Orders          Ordered    lidocaine (LIDODERM) 5 %  Every 24 hours         09/11/22 0731              Franne Forts, DO 09/18/22 2318

## 2022-09-11 NOTE — ED Triage Notes (Signed)
Pt here for pain in R rib area. Pt states she fell a week ago and has had "the normal aches and pains" in her rib area. Pt states that this am she was getting ready to leave for the beach when she coughed and states that now the pain in her ribs is "unbearable".

## 2022-09-11 NOTE — Discharge Instructions (Signed)
-  Please use incentive spirometer. -I understand that you are limited as to which medications you can take.  Please return if the pain is unbearable and you are agreeable to taking pain medications.  We would be more than happy to prescribe something for you. -Rest

## 2022-09-15 ENCOUNTER — Telehealth: Payer: Self-pay

## 2022-09-15 NOTE — Telephone Encounter (Signed)
Transition Care Management Unsuccessful Follow-up Telephone Call  Date of discharge and from where:  09/11/2022 [facility]  Attempts:  1st Attempt  Reason for unsuccessful TCM follow-up call:  Left voice message  Shihab States [facility]  [facility] Population Health Community Resource Care Guide   [email or web address]  ?? [phone]   Website: [email or web address]  [email or web address]

## 2022-09-18 ENCOUNTER — Telehealth: Payer: Self-pay

## 2022-09-18 NOTE — Telephone Encounter (Signed)
Transition Care Management Unsuccessful Follow-up Telephone Call  Date of discharge and from where:  09/11/2022 [facility]  Attempts:  2nd Attempt  Reason for unsuccessful TCM follow-up call:  Left voice message  [facility]  [facility] Population Health Community Resource Care Guide   [name]  ?? [phone]   Website: [email or web address]  [email or web address]

## 2022-10-19 DIAGNOSIS — Z0001 Encounter for general adult medical examination with abnormal findings: Secondary | ICD-10-CM | POA: Diagnosis not present

## 2022-10-19 DIAGNOSIS — E663 Overweight: Secondary | ICD-10-CM | POA: Diagnosis not present

## 2022-10-19 DIAGNOSIS — E538 Deficiency of other specified B group vitamins: Secondary | ICD-10-CM | POA: Diagnosis not present

## 2022-10-19 DIAGNOSIS — R7309 Other abnormal glucose: Secondary | ICD-10-CM | POA: Diagnosis not present

## 2022-10-19 DIAGNOSIS — Z6828 Body mass index (BMI) 28.0-28.9, adult: Secondary | ICD-10-CM | POA: Diagnosis not present

## 2022-10-19 DIAGNOSIS — I471 Supraventricular tachycardia, unspecified: Secondary | ICD-10-CM | POA: Diagnosis not present

## 2022-10-19 DIAGNOSIS — Z1331 Encounter for screening for depression: Secondary | ICD-10-CM | POA: Diagnosis not present

## 2022-12-04 ENCOUNTER — Other Ambulatory Visit: Payer: Self-pay | Admitting: Family Medicine

## 2022-12-04 DIAGNOSIS — Z1231 Encounter for screening mammogram for malignant neoplasm of breast: Secondary | ICD-10-CM

## 2022-12-19 ENCOUNTER — Other Ambulatory Visit: Payer: Self-pay | Admitting: Family Medicine

## 2022-12-19 DIAGNOSIS — N6011 Diffuse cystic mastopathy of right breast: Secondary | ICD-10-CM

## 2022-12-27 DIAGNOSIS — L570 Actinic keratosis: Secondary | ICD-10-CM | POA: Diagnosis not present

## 2022-12-27 DIAGNOSIS — Z1283 Encounter for screening for malignant neoplasm of skin: Secondary | ICD-10-CM | POA: Diagnosis not present

## 2022-12-27 DIAGNOSIS — X32XXXD Exposure to sunlight, subsequent encounter: Secondary | ICD-10-CM | POA: Diagnosis not present

## 2022-12-27 DIAGNOSIS — D225 Melanocytic nevi of trunk: Secondary | ICD-10-CM | POA: Diagnosis not present

## 2022-12-28 DIAGNOSIS — H608X1 Other otitis externa, right ear: Secondary | ICD-10-CM | POA: Diagnosis not present

## 2023-01-02 ENCOUNTER — Encounter (INDEPENDENT_AMBULATORY_CARE_PROVIDER_SITE_OTHER): Payer: Self-pay | Admitting: Ophthalmology

## 2023-01-02 ENCOUNTER — Encounter (INDEPENDENT_AMBULATORY_CARE_PROVIDER_SITE_OTHER): Payer: Medicare PPO | Admitting: Ophthalmology

## 2023-01-02 ENCOUNTER — Ambulatory Visit (INDEPENDENT_AMBULATORY_CARE_PROVIDER_SITE_OTHER): Payer: Medicare PPO | Admitting: Ophthalmology

## 2023-01-02 DIAGNOSIS — D3132 Benign neoplasm of left choroid: Secondary | ICD-10-CM | POA: Diagnosis not present

## 2023-01-02 DIAGNOSIS — H35372 Puckering of macula, left eye: Secondary | ICD-10-CM

## 2023-01-02 DIAGNOSIS — H26492 Other secondary cataract, left eye: Secondary | ICD-10-CM | POA: Diagnosis not present

## 2023-01-02 DIAGNOSIS — Z961 Presence of intraocular lens: Secondary | ICD-10-CM

## 2023-01-02 DIAGNOSIS — H35342 Macular cyst, hole, or pseudohole, left eye: Secondary | ICD-10-CM

## 2023-01-02 NOTE — Progress Notes (Addendum)
[facility] - Clinic Note  01/02/2023     CHIEF COMPLAINT Patient presents for Retina Follow Up   HISTORY OF PRESENT ILLNESS: Pamela Lang is a 72 y.o. female who presents to the clinic today for:   HPI     Retina Follow Up   Patient presents with  Other.  In left eye.  This started 1 year ago.  I, the attending physician,  performed the HPI with the patient and updated documentation appropriately.        Comments   Patient here for 1 year retina follow up for mac hole OS. Patient states vision doing pretty good . Still reading with readers. No eye pain. Not using drops.      Last edited by Rennis Chris, MD on 01/02/2023  5:12 PM.    Pt states vision is stable, she is still "reading and seeing", she has to turn her head to get the eye chart in view better  Referring physician: DeMarco, [place], OD [address] [place], [place] [address]   HISTORICAL INFORMATION:   Selected notes from the MEDICAL RECORD NUMBER Patient referred by Dr. Krista Blue to evaluate full thickness macular hole OS   CURRENT MEDICATIONS: No current outpatient medications on file. (Ophthalmic Drugs)   No current facility-administered medications for this visit. (Ophthalmic Drugs)   Current Outpatient Medications (Other)  Medication Sig   ALPRAZolam (XANAX) 0.25 MG tablet Take 0.25-0.5 mg by mouth every 6 (six) hours as needed for anxiety.   famotidine (PEPCID) 20 MG tablet Take 20 mg by mouth as needed for heartburn or indigestion.   lidocaine (LIDODERM) 5 % Place 1 patch onto the skin daily. Remove & Discard patch within 12 hours or as directed by MD   No current facility-administered medications for this visit. (Other)   REVIEW OF SYSTEMS: ROS   Positive for: Cardiovascular, Eyes Negative for: Constitutional, Gastrointestinal, Neurological, Skin, Genitourinary, Musculoskeletal, HENT, Endocrine, Respiratory, Psychiatric, Allergic/Imm, Heme/Lymph Last edited  by Laddie Aquas, COA on 01/02/2023  8:31 AM.     ALLERGIES Allergies  Allergen Reactions   Ceclor [Cefaclor] Nausea And Vomiting and Other (See Comments)    Severe stomach cramps   Cortisone Other (See Comments)    TIA   Propranolol Other (See Comments)    Decreased heart rate   Aspirin Other (See Comments)    Irritates ulcers    Augmentin [Amoxicillin-Pot Clavulanate] Nausea And Vomiting   Ciprodex [Ciprofloxacin-Dexamethasone]     Migraines   Codeine Nausea And Vomiting   Darvocet [Propoxyphene N-Acetaminophen] Nausea And Vomiting   Diazepam     Tachycardia    Doxycycline Nausea And Vomiting   Omnaris [Ciclesonide]     Migraines    Other     ALL STEROIDS CAUSE TACHYCARDIA    Sulfa Antibiotics Other (See Comments)    Migraine headache   Sumatriptan Other (See Comments)    Tachycardia    Zofran [Ondansetron Hcl]     Severe Migraines, Tachycardia    Zoloft [Sertraline Hcl]     Tachycardia    PAST MEDICAL HISTORY Past Medical History:  Diagnosis Date   Anxiety    Eczema    ear   GERD (gastroesophageal reflux disease)    diet controlled   Headache    occasional - last one in 2019   IBS (irritable bowel syndrome)    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   MVP (mitral valve prolapse)    no problems  Palpitations    EKG 12/24/18 and ECHO 01/22/19 normal in [facility]  - rx Diltiazem cd 120 mg qd but has not started until after 02/06/19 procedure with  Vanessa Barbara   PONV (postoperative nausea and vomiting)    SVT (supraventricular tachycardia) (HCC)    TIA (transient ischemic attack) 2012   TIA   Past Surgical History:  Procedure Laterality Date   25 GAUGE PARS PLANA VITRECTOMY WITH 20 GAUGE MVR PORT FOR MACULAR HOLE Left 02/06/2019   Procedure: 25 GAUGE PARS PLANA VITRECTOMY WITH 20 GAUGE MVR PORT FOR MACULAR HOLE;  Surgeon: Rennis Chris, MD;  Location: [facility] OR;  Service: Ophthalmology;  Laterality: Left;   BREAST CYST ASPIRATION Right    CATARACT EXTRACTION  Bilateral 2016   Dr. Elmer Picker   CESAREAN SECTION     x 2   CHOLECYSTECTOMY     COLONOSCOPY     Cyst B/L Wrist Bilateral    GAS/FLUID EXCHANGE Left 02/06/2019   Procedure: Gas/Fluid Exchange;  Surgeon: Rennis Chris, MD;  Location: [facility] OR;  Service: Ophthalmology;  Laterality: Left;  C3F8   PHOTOCOAGULATION WITH LASER Left 02/06/2019   Procedure: Photocoagulation With Laser;  Surgeon: Rennis Chris, MD;  Location: [facility] OR;  Service: Ophthalmology;  Laterality: Left;   TOOTH EXTRACTION Bilateral    wisdom toothe ext   TUBAL LIGATION     FAMILY HISTORY Family History  Problem Relation Age of Onset   Transient ischemic attack Mother    COPD Father    Heart disease Father    Diabetes Sister    Diabetes Sister    SOCIAL HISTORY Social History   Tobacco Use   Smoking status: Former    Types: Cigarettes   Smokeless tobacco: Former   Tobacco comments:    quit 30 yrs ago per patient 02/06/19  Vaping Use   Vaping status: Never Used  Substance Use Topics   Alcohol use: No   Drug use: No       OPHTHALMIC EXAM: Base Eye Exam     Visual Acuity (Snellen - Linear)       Right Left   Dist [place] 20/20 20/25  OS looking to the side to see letters.        Tonometry (Tonopen, 8:28 AM)       Right Left   Pressure 15 13         Pupils       Dark Light Shape React APD   Right 3 2 Round Brisk None   Left 3 2 Round Brisk None         Visual Fields (Counting fingers)       Left Right    Full Full         Extraocular Movement       Right Left    Full, Ortho Full, Ortho         Neuro/Psych     Oriented x3: Yes   Mood/Affect: Normal         Dilation     Both eyes: 1.0% Mydriacyl, 2.5% Phenylephrine @ 8:28 AM           Slit Lamp and Fundus Exam     Slit Lamp Exam       Right Left   Lids/Lashes Dermatochalasis - upper lid, mild MGD Dermatochalasis - upper lid, Meibomian gland dysfunction   Conjunctiva/Sclera White and quiet White and quiet   Cornea  mild arcus Trace Punctate epithelial erosions, mild Arcus, trace, fine endo pigment  Anterior Chamber Deep and quiet Deep and quiet   Iris Round and dilated Round and dilated   Lens PC IOL in good position with open PC PC IOL in good position with open PC   Anterior Vitreous Vitreous syneresis post vitrectomy, clear         Fundus Exam       Right Left   Disc Pink and Sharp, mild PPP Pink and Sharp   C/D Ratio 0.5 0.6   Macula Flat, good foveal reflex, trace Epiretinal membrane, No heme or edema Flat, blunted foveal reflex, mac hole closed, mild Retinal pigment epithelial mottling, trace cystic changes temporal macula, No heme or edema   Vessels Mild attenuation, mild Tortuosity, mild AV crossing changes, mild Copper wiring attenuated, mild tortuosity   Periphery Attached, no heme    Attached, pigmented choroidal lesion (nevus) at 0300 with +drusen, no SRF or orange pigment           IMAGING AND PROCEDURES  Imaging and Procedures for @TODAY @  OCT, Retina - OU - Both Eyes       Right Eye Quality was good. Central Foveal Thickness: 250. Progression has been stable. Findings include normal foveal contour, no IRF, no SRF, epiretinal membrane (partial PVD, trace ERM).   Left Eye Quality was good. Central Foveal Thickness: 276. Progression has worsened. Findings include no SRF, abnormal foveal contour, intraretinal fluid (Mac hole closed; mild inner retinal irregularity temporal macula, focal cystic changes IT macula--?schisis).   Notes *Images captured and stored on drive  Diagnosis / Impression:  OD: NFP, no IRF/SRF OS: Mac hole closed; mild inner retinal irregularity temporal macula, focal cystic changes IT macula--?schisis  Clinical management:  See below  Abbreviations: NFP - Normal foveal profile. CME - cystoid macular edema. PED - pigment epithelial detachment. IRF - intraretinal fluid. SRF - subretinal fluid. EZ - ellipsoid zone. ERM - epiretinal membrane. ORA - outer  retinal atrophy. ORT - outer retinal tubulation. SRHM - subretinal hyper-reflective material            ASSESSMENT/PLAN:    ICD-10-CM   1. Macular hole of left eye  H35.342 OCT, Retina - OU - Both Eyes    2. Epiretinal membrane (ERM) of left eye  H35.372 OCT, Retina - OU - Both Eyes    3. 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 Pseudophakia of both eyes  Z96.1     5. Left posterior capsular opacification  H26.492      1,2.  Macular Hole w/ ERM OS  - small full thickness macular hole  - +metamorphopsia, distortion centrally  - s/p PPV/TissueBlue stain/MP/14% C3F8 OS, 12.10.20  - OCT shows Mac hole closed; mild inner retinal irregularity temporal macula, focal cystic changes IT macula--?schisis             - retina attached              - IOP okay at 13  - f/u 4-6 months, DFE, OCT, possible FA (if cysts persist)  3. Choroidal Nevus OS -- stable  - relatively flat pigmented lesion  - located at 0300 periphery with +drusen, no SRF/orange pigment  - no suspicious features  - discussed findngs, prognosis  - baseline Optos images done on 12.22.20  - monitor   4,5. Pseudophakia OU  - s/p CE/IOL OU (Dr. Elmer Picker, 2016)  - s/p YAG capsulotomy OS (11.18.20)  - beautiful surgeries, doing well  - monitor  Ophthalmic Meds Ordered  this visit:  No orders of the defined types were placed in this encounter.    Return for f/u 4-6 mos - mac hole OS, DFE, OCT.  There are no Patient Instructions on file for this visit.  This document serves as a record of services personally performed by Karie Chimera, MD, PhD. It was created on their behalf by Glee Arvin. Manson Passey, OA an ophthalmic technician. The creation of this record is the provider's dictation and/or activities during the visit.    Electronically signed by: Glee Arvin. Manson Passey, OA 01/02/23 5:14 PM  Karie Chimera, M.D., Ph.D. Diseases & Surgery of the Retina and Vitreous [facility]  I have reviewed the  above documentation for accuracy and completeness, and I agree with the above. Karie Chimera, M.D., Ph.D. 01/02/23 5:14 PM  Abbreviations: M myopia (nearsighted); A astigmatism; H hyperopia (farsighted); P presbyopia; Mrx spectacle prescription;  CTL contact lenses; OD right eye; OS left eye; OU both eyes  XT exotropia; ET esotropia; PEK punctate epithelial keratitis; PEE punctate epithelial erosions; DES dry eye syndrome; MGD meibomian gland dysfunction; ATs artificial tears; PFAT's preservative free artificial tears; NSC nuclear sclerotic cataract; PSC posterior subcapsular cataract; ERM epi-retinal membrane; PVD posterior vitreous detachment; RD retinal detachment; DM diabetes mellitus; DR diabetic retinopathy; NPDR non-proliferative diabetic retinopathy; PDR proliferative diabetic retinopathy; CSME clinically significant macular edema; DME diabetic macular edema; dbh dot blot hemorrhages; CWS cotton wool spot; POAG primary open angle glaucoma; C/D cup-to-disc ratio; HVF humphrey visual field; GVF goldmann visual field; OCT optical coherence tomography; IOP intraocular pressure; BRVO Branch retinal vein occlusion; CRVO central retinal vein occlusion; CRAO central retinal artery occlusion; BRAO branch retinal artery occlusion; RT retinal tear; SB scleral buckle; PPV pars plana vitrectomy; VH Vitreous hemorrhage; PRP panretinal laser photocoagulation; IVK intravitreal kenalog; VMT vitreomacular traction; MH Macular hole;  NVD neovascularization of the disc; NVE neovascularization elsewhere; AREDS age related eye disease study; ARMD age related macular degeneration; POAG primary open angle glaucoma; EBMD epithelial/anterior basement membrane dystrophy; ACIOL anterior chamber intraocular lens; IOL intraocular lens; PCIOL posterior chamber intraocular lens; Phaco/IOL phacoemulsification with intraocular lens placement; PRK photorefractive keratectomy; LASIK laser assisted in situ keratomileusis; HTN hypertension;  DM diabetes mellitus; COPD chronic obstructive pulmonary disease

## 2023-01-05 ENCOUNTER — Ambulatory Visit
Admission: RE | Admit: 2023-01-05 | Discharge: 2023-01-05 | Disposition: A | Discharge status: Home / Self Care | Payer: Medicare PPO | Source: Ambulatory Visit | Attending: Family Medicine | Admitting: Family Medicine

## 2023-01-05 DIAGNOSIS — Z1231 Encounter for screening mammogram for malignant neoplasm of breast: Secondary | ICD-10-CM

## 2023-04-05 DIAGNOSIS — K2100 Gastro-esophageal reflux disease with esophagitis, without bleeding: Secondary | ICD-10-CM | POA: Diagnosis not present

## 2023-04-05 DIAGNOSIS — E663 Overweight: Secondary | ICD-10-CM | POA: Diagnosis not present

## 2023-04-05 DIAGNOSIS — E538 Deficiency of other specified B group vitamins: Secondary | ICD-10-CM | POA: Diagnosis not present

## 2023-04-05 DIAGNOSIS — Z6828 Body mass index (BMI) 28.0-28.9, adult: Secondary | ICD-10-CM | POA: Diagnosis not present

## 2023-04-05 DIAGNOSIS — R7309 Other abnormal glucose: Secondary | ICD-10-CM | POA: Diagnosis not present

## 2023-04-05 DIAGNOSIS — H9201 Otalgia, right ear: Secondary | ICD-10-CM | POA: Diagnosis not present

## 2023-04-26 ENCOUNTER — Ambulatory Visit (HOSPITAL_BASED_OUTPATIENT_CLINIC_OR_DEPARTMENT_OTHER): Payer: Medicare PPO | Admitting: Cardiovascular Disease

## 2023-04-30 NOTE — Progress Notes (Signed)
 [facility] - Clinic Note  05/07/2023     CHIEF COMPLAINT Patient presents for Retina Evaluation   HISTORY OF PRESENT ILLNESS: Pamela Lang is a 73 y.o. female who presents to the clinic today for:   HPI     Retina Evaluation   In left eye.  Duration of 4 months.  Context:  distance vision, mid-range vision and near vision.  Treatments tried include no treatments.  I, the attending physician,  performed the HPI with the patient and updated documentation appropriately.        Comments   Pt states her sight in the left eye is still bad and she finds herself closing and relying on her right eye. Pt denies any discomfort. Pt is not using any drops. Pt would like more explanation on the health of her eye.      Last edited by Rennis Chris, MD on 05/07/2023  9:40 PM.     Pt has not noticed any change in vision  Referring physician: DeMarco, [place], OD [address] [place], [place] [address]   HISTORICAL INFORMATION:   Selected notes from the MEDICAL RECORD NUMBER Patient referred by Dr. Krista Blue to evaluate full thickness macular hole OS   CURRENT MEDICATIONS: Current Outpatient Medications (Ophthalmic Drugs)  Medication Sig   Bromfenac Sodium 0.07 % SOLN Place 1 drop into the left eye 4 (four) times daily.   No current facility-administered medications for this visit. (Ophthalmic Drugs)   Current Outpatient Medications (Other)  Medication Sig   famotidine (PEPCID) 20 MG tablet Take 20 mg by mouth as needed for heartburn or indigestion.   ALPRAZolam (XANAX) 0.25 MG tablet Take 0.25-0.5 mg by mouth every 6 (six) hours as needed for anxiety. (Patient not taking: Reported on 05/07/2023)   lidocaine (LIDODERM) 5 % Place 1 patch onto the skin daily. Remove & Discard patch within 12 hours or as directed by MD (Patient not taking: Reported on 05/07/2023)   No current facility-administered medications for this visit. (Other)   REVIEW OF  SYSTEMS: ROS   Positive for: Cardiovascular, Eyes Negative for: Constitutional, Gastrointestinal, Neurological, Skin, Genitourinary, Musculoskeletal, HENT, Endocrine, Respiratory, Psychiatric, Allergic/Imm, Heme/Lymph Last edited by Elicia Lamp, COT on 05/07/2023  9:43 AM.      ALLERGIES Allergies  Allergen Reactions   Ceclor [Cefaclor] Nausea And Vomiting and Other (See Comments)    Severe stomach cramps   Cortisone Other (See Comments)    TIA   Propranolol Other (See Comments)    Decreased heart rate   Aspirin Other (See Comments)    Irritates ulcers    Augmentin [Amoxicillin-Pot Clavulanate] Nausea And Vomiting   Ciprodex [Ciprofloxacin-Dexamethasone]     Migraines   Codeine Nausea And Vomiting   Darvocet [Propoxyphene N-Acetaminophen] Nausea And Vomiting   Diazepam     Tachycardia    Doxycycline Nausea And Vomiting   Omnaris [Ciclesonide]     Migraines    Other     ALL STEROIDS CAUSE TACHYCARDIA    Sulfa Antibiotics Other (See Comments)    Migraine headache   Sumatriptan Other (See Comments)    Tachycardia    Zofran [Ondansetron Hcl]     Severe Migraines, Tachycardia    Zoloft [Sertraline Hcl]     Tachycardia    PAST MEDICAL HISTORY Past Medical History:  Diagnosis Date   Anxiety    Eczema    ear   GERD (gastroesophageal reflux disease)    diet controlled   Headache  occasional - last one in 2019   IBS (irritable bowel syndrome)    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   MVP (mitral valve prolapse)    no problems   Palpitations    EKG 12/24/18 and ECHO 01/22/19 normal in [facility]  - rx Diltiazem cd 120 mg qd but has not started until after 02/06/19 procedure with  Vanessa Barbara   PONV (postoperative nausea and vomiting)    SVT (supraventricular tachycardia) (HCC)    TIA (transient ischemic attack) 2012   TIA   Past Surgical History:  Procedure Laterality Date   25 GAUGE PARS PLANA VITRECTOMY WITH 20 GAUGE MVR PORT FOR MACULAR HOLE Left 02/06/2019    Procedure: 25 GAUGE PARS PLANA VITRECTOMY WITH 20 GAUGE MVR PORT FOR MACULAR HOLE;  Surgeon: Rennis Chris, MD;  Location: [facility] OR;  Service: Ophthalmology;  Laterality: Left;   BREAST CYST ASPIRATION Right    CATARACT EXTRACTION Bilateral 2016   Dr. Elmer Picker   CESAREAN SECTION     x 2   CHOLECYSTECTOMY     COLONOSCOPY     Cyst B/L Wrist Bilateral    GAS/FLUID EXCHANGE Left 02/06/2019   Procedure: Gas/Fluid Exchange;  Surgeon: Rennis Chris, MD;  Location: [facility] OR;  Service: Ophthalmology;  Laterality: Left;  C3F8   PHOTOCOAGULATION WITH LASER Left 02/06/2019   Procedure: Photocoagulation With Laser;  Surgeon: Rennis Chris, MD;  Location: [facility] OR;  Service: Ophthalmology;  Laterality: Left;   TOOTH EXTRACTION Bilateral    wisdom toothe ext   TUBAL LIGATION     FAMILY HISTORY Family History  Problem Relation Age of Onset   Transient ischemic attack Mother    COPD Father    Heart disease Father    Diabetes Sister    Diabetes Sister    SOCIAL HISTORY Social History   Tobacco Use   Smoking status: Former    Types: Cigarettes   Smokeless tobacco: Former   Tobacco comments:    quit 30 yrs ago per patient 02/06/19  Vaping Use   Vaping status: Never Used  Substance Use Topics   Alcohol use: No   Drug use: No       OPHTHALMIC EXAM: Base Eye Exam     Visual Acuity (Snellen - Linear)       Right Left   Dist cc 20/20 20/30 +1   Dist ph cc  NI         Tonometry (Tonopen, 9:55 AM)       Right Left   Pressure 12 11         Pupils       Dark Light Shape React APD   Right 3 2 Round Brisk None   Left 4 3 Round Brisk None         Visual Fields       Left Right    Full Full         Extraocular Movement       Right Left    Full, Ortho Full, Ortho         Neuro/Psych     Oriented x3: Yes   Mood/Affect: Normal         Dilation     Both eyes: 1.0% Mydriacyl, 2.5% Phenylephrine @ 9:52 AM           Slit Lamp and Fundus Exam     Slit Lamp  Exam       Right Left   Lids/Lashes Dermatochalasis - upper lid Dermatochalasis - upper  lid   Conjunctiva/Sclera White and quiet White and quiet   Cornea mild arcus, well healed cataract wound mild Arcus, trace, well healed cataract wound   Anterior Chamber deep and clear deep and clear, no cell or flare   Iris Round and dilated Round and dilated   Lens PC IOL in good position with open PC PC IOL in good position with open PC   Anterior Vitreous Vitreous syneresis post vitrectomy, clear         Fundus Exam       Right Left   Disc Pink and Sharp, mild PPP Pink and Sharp   C/D Ratio 0.5 0.6   Macula Flat, good foveal reflex, trace Epiretinal membrane, No heme or edema Flat, blunted foveal reflex, mac hole closed, mild Retinal pigment epithelial mottling, mild cystic changes temporal macula -- slightly increased, No heme   Vessels Mild attenuation, mild Tortuosity, mild AV crossing changes, mild Copper wiring attenuated, mild tortuosity   Periphery Attached, no heme    Attached, pigmented choroidal lesion (nevus) at 0300 with +drusen, no SRF or orange pigment           Refraction     Wearing Rx       Sphere Cylinder Axis   Right      Left -1.00 +0.50 180           IMAGING AND PROCEDURES  Imaging and Procedures for @TODAY @  OCT, Retina - OU - Both Eyes       Right Eye Quality was good. Central Foveal Thickness: 247. Progression has been stable. Findings include normal foveal contour, no IRF, no SRF, epiretinal membrane (partial PVD, trace ERM).   Left Eye Quality was good. Central Foveal Thickness: 279. Progression has worsened. Findings include no SRF, abnormal foveal contour, intraretinal fluid (Mac hole closed; mild inner retinal irregularity temporal macula, focal cystic changes IT macula -- ?schisis -- slightly increased).   Notes *Images captured and stored on drive  Diagnosis / Impression:  OD: NFP, no IRF/SRF OS: Mac hole closed; mild inner retinal  irregularity temporal macula, focal cystic changes IT macula -- slightly increased -- ?schisis vs focal CME  Clinical management:  See below  Abbreviations: NFP - Normal foveal profile. CME - cystoid macular edema. PED - pigment epithelial detachment. IRF - intraretinal fluid. SRF - subretinal fluid. EZ - ellipsoid zone. ERM - epiretinal membrane. ORA - outer retinal atrophy. ORT - outer retinal tubulation. SRHM - subretinal hyper-reflective material            ASSESSMENT/PLAN:    ICD-10-CM   1. Macular hole of left eye  H35.342 OCT, Retina - OU - Both Eyes    2. Epiretinal membrane (ERM) of left eye  H35.372     3. 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 Pseudophakia of both eyes  Z96.1      1,2.  Macular Hole w/ ERM OS  - small full thickness macular hole  - +metamorphopsia, distortion centrally  - s/p PPV/TissueBlue stain/MP/14% C3F8 OS, 12.10.20  - OCT shows Mac hole closed; mild inner retinal irregularity temporal macula, focal cystic changes IT macula -- slightly increased -- ?schisis vs CME  - recommended FA to rule out CME but pt deferred test  - will start bromfenac QID OS for possible CME component to cystic changes / edema (recommended Prednisolone Acetate 1%, but pt declined due to history of adverse reactions to steroid medications)             -  retina attached              - IOP okay at 11  - f/u 4-6 months, DFE, OCT, possible FA (if cysts persist)  3. Choroidal Nevus OS -- stable  - relatively flat pigmented lesion  - located at 0300 periphery with +drusen, no SRF/orange pigment  - no suspicious features  - discussed findngs, prognosis  - baseline Optos images done on 12.22.20  - monitor   4,5. Pseudophakia OU  - s/p CE/IOL OU (Dr. Elmer Picker, 2016)  - s/p YAG capsulotomy OS (11.18.20)  - beautiful surgeries, doing well  - monitor  Ophthalmic Meds Ordered this visit:  Meds ordered this encounter  Medications   DISCONTD: Bromfenac Sodium 0.07 % SOLN     Sig: Place 1 drop into the left eye 4 (four) times daily.    Dispense:  3 mL    Refill:  6   Bromfenac Sodium 0.07 % SOLN    Sig: Place 1 drop into the left eye 4 (four) times daily.    Dispense:  3 mL    Refill:  6     Return in about 4 weeks (around 06/04/2023) for f/u CME OS, DFE, OCT.  There are no Patient Instructions on file for this visit.  This document serves as a record of services personally performed by Karie Chimera, MD, PhD. It was created on their behalf by Glee Arvin. Manson Passey, OA an ophthalmic technician. The creation of this record is the provider's dictation and/or activities during the visit.    Electronically signed by: Glee Arvin. Manson Passey, OA 05/07/23 9:41 PM   Karie Chimera, M.D., Ph.D. Diseases & Surgery of the Retina and Vitreous [facility]  I have reviewed the above documentation for accuracy and completeness, and I agree with the above. Karie Chimera, M.D., Ph.D. 05/07/23 9:44 PM   Abbreviations: M myopia (nearsighted); A astigmatism; H hyperopia (farsighted); P presbyopia; Mrx spectacle prescription;  CTL contact lenses; OD right eye; OS left eye; OU both eyes  XT exotropia; ET esotropia; PEK punctate epithelial keratitis; PEE punctate epithelial erosions; DES dry eye syndrome; MGD meibomian gland dysfunction; ATs artificial tears; PFAT's preservative free artificial tears; NSC nuclear sclerotic cataract; PSC posterior subcapsular cataract; ERM epi-retinal membrane; PVD posterior vitreous detachment; RD retinal detachment; DM diabetes mellitus; DR diabetic retinopathy; NPDR non-proliferative diabetic retinopathy; PDR proliferative diabetic retinopathy; CSME clinically significant macular edema; DME diabetic macular edema; dbh dot blot hemorrhages; CWS cotton wool spot; POAG primary open angle glaucoma; C/D cup-to-disc ratio; HVF humphrey visual field; GVF goldmann visual field; OCT optical coherence tomography; IOP intraocular pressure; BRVO  Branch retinal vein occlusion; CRVO central retinal vein occlusion; CRAO central retinal artery occlusion; BRAO branch retinal artery occlusion; RT retinal tear; SB scleral buckle; PPV pars plana vitrectomy; VH Vitreous hemorrhage; PRP panretinal laser photocoagulation; IVK intravitreal kenalog; VMT vitreomacular traction; MH Macular hole;  NVD neovascularization of the disc; NVE neovascularization elsewhere; AREDS age related eye disease study; ARMD age related macular degeneration; POAG primary open angle glaucoma; EBMD epithelial/anterior basement membrane dystrophy; ACIOL anterior chamber intraocular lens; IOL intraocular lens; PCIOL posterior chamber intraocular lens; Phaco/IOL phacoemulsification with intraocular lens placement; PRK photorefractive keratectomy; LASIK laser assisted in situ keratomileusis; HTN hypertension; DM diabetes mellitus; COPD chronic obstructive pulmonary disease

## 2023-05-07 ENCOUNTER — Ambulatory Visit (INDEPENDENT_AMBULATORY_CARE_PROVIDER_SITE_OTHER): Payer: Medicare PPO | Admitting: Ophthalmology

## 2023-05-07 ENCOUNTER — Encounter (INDEPENDENT_AMBULATORY_CARE_PROVIDER_SITE_OTHER): Payer: Self-pay | Admitting: Ophthalmology

## 2023-05-07 DIAGNOSIS — Z961 Presence of intraocular lens: Secondary | ICD-10-CM | POA: Diagnosis not present

## 2023-05-07 DIAGNOSIS — H35342 Macular cyst, hole, or pseudohole, left eye: Secondary | ICD-10-CM

## 2023-05-07 DIAGNOSIS — H35372 Puckering of macula, left eye: Secondary | ICD-10-CM | POA: Diagnosis not present

## 2023-05-07 DIAGNOSIS — D3132 Benign neoplasm of left choroid: Secondary | ICD-10-CM | POA: Diagnosis not present

## 2023-05-07 MED ORDER — BROMFENAC SODIUM 0.07 % OP SOLN: 1.0000 [drp] | Freq: Four times a day (QID) | OPHTHALMIC | 6 refills | Status: DC

## 2023-05-07 MED ORDER — BROMFENAC SODIUM 0.07 % OP SOLN: 1.0000 [drp] | Freq: Four times a day (QID) | OPHTHALMIC | 6 refills | Status: AC

## 2023-05-08 ENCOUNTER — Other Ambulatory Visit (INDEPENDENT_AMBULATORY_CARE_PROVIDER_SITE_OTHER): Payer: Self-pay

## 2023-05-08 MED ORDER — PROLENSA 0.07 % OP SOLN: 1.0000 [drp] | Freq: Four times a day (QID) | OPHTHALMIC | 6 refills | Status: AC

## 2023-05-10 ENCOUNTER — Other Ambulatory Visit (INDEPENDENT_AMBULATORY_CARE_PROVIDER_SITE_OTHER): Payer: Self-pay

## 2023-05-10 MED ORDER — KETOROLAC TROMETHAMINE 0.5 % OP SOLN: 1.0000 [drp] | Freq: Four times a day (QID) | OPHTHALMIC | 2 refills | Status: AC

## 2023-05-31 NOTE — Progress Notes (Signed)
 [facility] - Clinic Note  06/04/2023     CHIEF COMPLAINT Patient presents for Retina Evaluation   HISTORY OF PRESENT ILLNESS: Pamela Lang is a 73 y.o. female who presents to the clinic today for:   HPI     Retina Evaluation   In left eye.  Duration of 4 months.  Context:  distance vision, mid-range vision and near vision.  Treatments tried include no treatments.  I, the attending physician,  performed the HPI with the patient and updated documentation appropriately.        Comments   Patient feels the vision is the same. She states that after she left the office the left eye was hurting, watering and her face hurt. She is not using eye drops at this time.       Last edited by Ronelle Coffee, MD on 06/09/2023  2:08 AM.    Pt has not been taking any drops that were prescribed to her, she states she cannot take ibuprofen or steroids, pt states she has a chronic ear infection that she has been on drops for, but states she has found out the drops can cause problems with the retina  Referring physician: DeMarco, [place], OD [address] [place], [place] [address]   HISTORICAL INFORMATION:   Selected notes from the MEDICAL RECORD NUMBER Patient referred by Dr. Simuel Ducking to evaluate full thickness macular hole OS   CURRENT MEDICATIONS: Current Outpatient Medications (Ophthalmic Drugs)  Medication Sig   Bromfenac Sodium 0.07 % SOLN Place 1 drop into the left eye 4 (four) times daily.   ketorolac (ACULAR) 0.5 % ophthalmic solution Place 1 drop into the left eye 4 (four) times daily.   PROLENSA 0.07 % SOLN Place 1 drop into the left eye 4 (four) times daily.   No current facility-administered medications for this visit. (Ophthalmic Drugs)   Current Outpatient Medications (Other)  Medication Sig   ALPRAZolam (XANAX) 0.25 MG tablet Take 0.25-0.5 mg by mouth every 6 (six) hours as needed for anxiety. (Patient not taking: Reported on 05/07/2023)    famotidine (PEPCID) 20 MG tablet Take 20 mg by mouth as needed for heartburn or indigestion.   lidocaine (LIDODERM) 5 % Place 1 patch onto the skin daily. Remove & Discard patch within 12 hours or as directed by MD (Patient not taking: Reported on 05/07/2023)   No current facility-administered medications for this visit. (Other)   REVIEW OF SYSTEMS: ROS   Positive for: Cardiovascular, Eyes Negative for: Constitutional, Gastrointestinal, Neurological, Skin, Genitourinary, Musculoskeletal, HENT, Endocrine, Respiratory, Psychiatric, Allergic/Imm, Heme/Lymph Last edited by Olene Berne, COT on 06/04/2023  9:03 AM.       ALLERGIES Allergies  Allergen Reactions   Ceclor [Cefaclor] Nausea And Vomiting and Other (See Comments)    Severe stomach cramps   Cortisone Other (See Comments)    TIA   Propranolol Other (See Comments)    Decreased heart rate   Aspirin Other (See Comments)    Irritates ulcers    Augmentin [Amoxicillin-Pot Clavulanate] Nausea And Vomiting   Ciprodex [Ciprofloxacin-Dexamethasone]     Migraines   Codeine Nausea And Vomiting   Darvocet [Propoxyphene N-Acetaminophen] Nausea And Vomiting   Diazepam     Tachycardia    Doxycycline Nausea And Vomiting   Omnaris [Ciclesonide]     Migraines    Other     ALL STEROIDS CAUSE TACHYCARDIA    Sulfa Antibiotics Other (See Comments)    Migraine headache  Sumatriptan Other (See Comments)    Tachycardia    Zofran [Ondansetron Hcl]     Severe Migraines, Tachycardia    Zoloft [Sertraline Hcl]     Tachycardia    PAST MEDICAL HISTORY Past Medical History:  Diagnosis Date   Anxiety    Eczema    ear   GERD (gastroesophageal reflux disease)    diet controlled   Headache    occasional - last one in 2019   IBS (irritable bowel syndrome)    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   MVP (mitral valve prolapse)    no problems   Palpitations    EKG 12/24/18 and ECHO 01/22/19 normal in [facility]  - rx Diltiazem cd 120  mg qd but has not started until after 02/06/19 procedure with  Sebastian Lurz   PONV (postoperative nausea and vomiting)    SVT (supraventricular tachycardia) (HCC)    TIA (transient ischemic attack) 2012   TIA   Past Surgical History:  Procedure Laterality Date   25 GAUGE PARS PLANA VITRECTOMY WITH 20 GAUGE MVR PORT FOR MACULAR HOLE Left 02/06/2019   Procedure: 25 GAUGE PARS PLANA VITRECTOMY WITH 20 GAUGE MVR PORT FOR MACULAR HOLE;  Surgeon: Ronelle Coffee, MD;  Location: [facility] OR;  Service: Ophthalmology;  Laterality: Left;   BREAST CYST ASPIRATION Right    CATARACT EXTRACTION Bilateral 2016   Dr. Lasandra Points   CESAREAN SECTION     x 2   CHOLECYSTECTOMY     COLONOSCOPY     Cyst B/L Wrist Bilateral    GAS/FLUID EXCHANGE Left 02/06/2019   Procedure: Gas/Fluid Exchange;  Surgeon: Ronelle Coffee, MD;  Location: [facility] OR;  Service: Ophthalmology;  Laterality: Left;  C3F8   PHOTOCOAGULATION WITH LASER Left 02/06/2019   Procedure: Photocoagulation With Laser;  Surgeon: Ronelle Coffee, MD;  Location[facility] OR;  Service: Ophthalmology;  Laterality: Left;   TOOTH EXTRACTION Bilateral    wisdom toothe ext   TUBAL LIGATION     FAMILY HISTORY Family History  Problem Relation Age of Onset   Transient ischemic attack Mother    COPD Father    Heart disease Father    Diabetes Sister    Diabetes Sister    SOCIAL HISTORY Social History   Tobacco Use   Smoking status: Former    Types: Cigarettes   Smokeless tobacco: Former   Tobacco comments:    quit 30 yrs ago per patient 02/06/19  Vaping Use   Vaping status: Never Used  Substance Use Topics   Alcohol use: No   Drug use: No       OPHTHALMIC EXAM: Base Eye Exam     Visual Acuity (Snellen - Linear)       Right Left   Dist [place]  20/20   Dist cc 20/20 +1     Correction: Glasses         Tonometry (Tonopen, 9:07 AM)       Right Left   Pressure 11 12         Pupils       Dark Shape   Right 3 Round   Left 3 Round         Visual Fields        Left Right    Full Full         Extraocular Movement       Right Left    Full, Ortho Full, Ortho         Neuro/Psych     Oriented x3:  Yes   Mood/Affect: Normal         Dilation     Both eyes: 1.0% Mydriacyl, 2.5% Phenylephrine @ 9:04 AM           Slit Lamp and Fundus Exam     Slit Lamp Exam       Right Left   Lids/Lashes Dermatochalasis - upper lid Dermatochalasis - upper lid   Conjunctiva/Sclera White and quiet White and quiet   Cornea mild arcus, well healed cataract wound mild Arcus, trace tear film debris, well healed cataract wound   Anterior Chamber deep and clear deep and clear, no cell or flare   Iris Round and dilated Round and dilated   Lens PC IOL in good position with open PC PC IOL in good position with open PC   Anterior Vitreous Vitreous syneresis post vitrectomy, clear         Fundus Exam       Right Left   Disc Pink and Sharp, mild PPP Pink and Sharp   C/D Ratio 0.5 0.6   Macula Flat, good foveal reflex, trace Epiretinal membrane, No heme or edema Flat, blunted foveal reflex, mac hole closed, mild Retinal pigment epithelial mottling, mild cystic changes temporal macula -- persistent, No heme   Vessels Mild attenuation, mild Tortuosity, mild AV crossing changes attenuated, mild tortuosity   Periphery Attached, no heme Attached, pigmented choroidal lesion (nevus) at 0300 with +drusen, no SRF or orange pigment           Refraction     Wearing Rx       Sphere Cylinder Axis   Right      Left -1.00 +0.50 180           IMAGING AND PROCEDURES  Imaging and Procedures for @TODAY @  OCT, Retina - OU - Both Eyes       Right Eye Quality was good. Central Foveal Thickness: 250. Progression has been stable. Findings include normal foveal contour, no IRF, no SRF, epiretinal membrane (partial PVD, trace ERM).   Left Eye Quality was good. Central Foveal Thickness: 274. Progression has improved. Findings include no SRF, abnormal  foveal contour, intraretinal fluid (Mac hole closed; mild inner retinal irregularity temporal macula, persistent focal cystic changes IT macula -- ?schisis -- slightly improved).   Notes *Images captured and stored on drive  Diagnosis / Impression:  OD: NFP, no IRF/SRF OS: Mac hole closed; mild inner retinal irregularity temporal macula, persistent focal cystic changes IT macula -- ?schisis -- slightly improved  Clinical management:  See below  Abbreviations: NFP - Normal foveal profile. CME - cystoid macular edema. PED - pigment epithelial detachment. IRF - intraretinal fluid. SRF - subretinal fluid. EZ - ellipsoid zone. ERM - epiretinal membrane. ORA - outer retinal atrophy. ORT - outer retinal tubulation. SRHM - subretinal hyper-reflective material            ASSESSMENT/PLAN:    ICD-10-CM   1. Macular hole of left eye  H35.342 OCT, Retina - OU - Both Eyes    2. Epiretinal membrane (ERM) of left eye  H35.372     3. 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 Pseudophakia of both eyes  Z96.1     5. Left posterior capsular opacification  H26.492      1,2.  Macular Hole w/ ERM OS  - small full thickness macular hole  - +metamorphopsia, distortion centrally  - s/p PPV/TissueBlue stain/MP/14% C3F8 OS, 12.10.20  -  OCT shows Mac hole closed; mild inner retinal irregularity temporal macula, persistent focal cystic changes IT macula -- slightly improved -- ?schisis vs CME  - recommended FA to rule out CME but pt deferred test  - will start bromfenac QID OS for possible CME component to cystic changes / edema (recommended Prednisolone Acetate 1%, but pt declined due to history of adverse reactions to steroid medications) -- pt has not been using any drops             - retina attached              - IOP okay at 12  - pt can f/u PRN  3. Choroidal Nevus OS -- stable  - relatively flat pigmented lesion  - located at 0300 periphery with +drusen, no SRF/orange pigment  - no  suspicious features  - discussed findngs, prognosis  - baseline Optos images done on 12.22.20  - monitor   4,5. Pseudophakia OU  - s/p CE/IOL OU (Dr. Lasandra Points, 2016)  - s/p YAG capsulotomy OS (11.18.20)  - beautiful surgeries, doing well  - monitor  Ophthalmic Meds Ordered this visit:  No orders of the defined types were placed in this encounter.    Return if symptoms worsen or fail to improve.  There are no Patient Instructions on file for this visit.  This document serves as a record of services personally performed by Jeanice Millard, MD, PhD. It was created on their behalf by Morley Arabia. Bevin Bucks, OA an ophthalmic technician. The creation of this record is the provider's dictation and/or activities during the visit.    Electronically signed by: Morley Arabia. Bevin Bucks, OA 06/09/23 2:10 AM  Jeanice Millard, M.D., Ph.D. Diseases & Surgery of the Retina and Vitreous [facility]  I have reviewed the above documentation for accuracy and completeness, and I agree with the above. Jeanice Millard, M.D., Ph.D. 06/09/23 9:34 AM   Abbreviations: M myopia (nearsighted); A astigmatism; H hyperopia (farsighted); P presbyopia; Mrx spectacle prescription;  CTL contact lenses; OD right eye; OS left eye; OU both eyes  XT exotropia; ET esotropia; PEK punctate epithelial keratitis; PEE punctate epithelial erosions; DES dry eye syndrome; MGD meibomian gland dysfunction; ATs artificial tears; PFAT's preservative free artificial tears; NSC nuclear sclerotic cataract; PSC posterior subcapsular cataract; ERM epi-retinal membrane; PVD posterior vitreous detachment; RD retinal detachment; DM diabetes mellitus; DR diabetic retinopathy; NPDR non-proliferative diabetic retinopathy; PDR proliferative diabetic retinopathy; CSME clinically significant macular edema; DME diabetic macular edema; dbh dot blot hemorrhages; CWS cotton wool spot; POAG primary open angle glaucoma; C/D cup-to-disc ratio; HVF  humphrey visual field; GVF goldmann visual field; OCT optical coherence tomography; IOP intraocular pressure; BRVO Branch retinal vein occlusion; CRVO central retinal vein occlusion; CRAO central retinal artery occlusion; BRAO branch retinal artery occlusion; RT retinal tear; SB scleral buckle; PPV pars plana vitrectomy; VH Vitreous hemorrhage; PRP panretinal laser photocoagulation; IVK intravitreal kenalog; VMT vitreomacular traction; MH Macular hole;  NVD neovascularization of the disc; NVE neovascularization elsewhere; AREDS age related eye disease study; ARMD age related macular degeneration; POAG primary open angle glaucoma; EBMD epithelial/anterior basement membrane dystrophy; ACIOL anterior chamber intraocular lens; IOL intraocular lens; PCIOL posterior chamber intraocular lens; Phaco/IOL phacoemulsification with intraocular lens placement; PRK photorefractive keratectomy; LASIK laser assisted in situ keratomileusis; HTN hypertension; DM diabetes mellitus; COPD chronic obstructive pulmonary disease

## 2023-06-04 ENCOUNTER — Ambulatory Visit (INDEPENDENT_AMBULATORY_CARE_PROVIDER_SITE_OTHER): Admitting: Ophthalmology

## 2023-06-04 DIAGNOSIS — Z961 Presence of intraocular lens: Secondary | ICD-10-CM

## 2023-06-04 DIAGNOSIS — H35372 Puckering of macula, left eye: Secondary | ICD-10-CM | POA: Diagnosis not present

## 2023-06-04 DIAGNOSIS — H26492 Other secondary cataract, left eye: Secondary | ICD-10-CM

## 2023-06-04 DIAGNOSIS — H35342 Macular cyst, hole, or pseudohole, left eye: Secondary | ICD-10-CM

## 2023-06-04 DIAGNOSIS — D3132 Benign neoplasm of left choroid: Secondary | ICD-10-CM | POA: Diagnosis not present

## 2023-06-09 ENCOUNTER — Encounter (INDEPENDENT_AMBULATORY_CARE_PROVIDER_SITE_OTHER): Payer: Self-pay | Admitting: Ophthalmology

## 2023-06-25 DIAGNOSIS — H60541 Acute eczematoid otitis externa, right ear: Secondary | ICD-10-CM | POA: Diagnosis not present

## 2023-06-27 DIAGNOSIS — L820 Inflamed seborrheic keratosis: Secondary | ICD-10-CM | POA: Diagnosis not present

## 2023-06-27 DIAGNOSIS — L570 Actinic keratosis: Secondary | ICD-10-CM | POA: Diagnosis not present

## 2023-06-27 DIAGNOSIS — L218 Other seborrheic dermatitis: Secondary | ICD-10-CM | POA: Diagnosis not present

## 2023-06-27 DIAGNOSIS — X32XXXD Exposure to sunlight, subsequent encounter: Secondary | ICD-10-CM | POA: Diagnosis not present

## 2023-11-27 DIAGNOSIS — N390 Urinary tract infection, site not specified: Secondary | ICD-10-CM | POA: Diagnosis not present

## 2023-12-06 ENCOUNTER — Other Ambulatory Visit (HOSPITAL_COMMUNITY): Payer: Self-pay | Admitting: Physician Assistant

## 2023-12-06 DIAGNOSIS — R1031 Right lower quadrant pain: Secondary | ICD-10-CM

## 2023-12-09 ENCOUNTER — Ambulatory Visit (HOSPITAL_COMMUNITY)

## 2023-12-09 ENCOUNTER — Encounter (HOSPITAL_COMMUNITY): Payer: Self-pay

## 2023-12-13 ENCOUNTER — Encounter (HOSPITAL_COMMUNITY): Payer: Self-pay

## 2023-12-13 ENCOUNTER — Ambulatory Visit (HOSPITAL_COMMUNITY)
Admission: RE | Admit: 2023-12-13 | Discharge: 2023-12-13 | Disposition: A | Discharge status: Home / Self Care | Source: Ambulatory Visit | Attending: Physician Assistant | Admitting: Physician Assistant

## 2023-12-13 DIAGNOSIS — R1031 Right lower quadrant pain: Secondary | ICD-10-CM

## 2023-12-14 ENCOUNTER — Ambulatory Visit (HOSPITAL_COMMUNITY)
Admission: RE | Admit: 2023-12-14 | Discharge: 2023-12-14 | Disposition: A | Source: Ambulatory Visit | Attending: Physician Assistant | Admitting: Physician Assistant

## 2023-12-14 DIAGNOSIS — R1031 Right lower quadrant pain: Secondary | ICD-10-CM | POA: Insufficient documentation

## 2024-01-03 ENCOUNTER — Other Ambulatory Visit: Payer: Self-pay

## 2024-01-03 DIAGNOSIS — N200 Calculus of kidney: Secondary | ICD-10-CM

## 2024-01-04 ENCOUNTER — Ambulatory Visit: Admitting: Urology

## 2024-01-04 ENCOUNTER — Ambulatory Visit (HOSPITAL_COMMUNITY)
Admission: RE | Admit: 2024-01-04 | Discharge: 2024-01-04 | Disposition: A | Discharge status: Home / Self Care | Source: Ambulatory Visit | Attending: Urology | Admitting: Urology

## 2024-01-04 ENCOUNTER — Encounter: Payer: Self-pay | Admitting: Urology

## 2024-01-04 VITALS — BP 113/70 | HR 92

## 2024-01-04 DIAGNOSIS — N200 Calculus of kidney: Secondary | ICD-10-CM | POA: Diagnosis present

## 2024-01-04 LAB — URINALYSIS, ROUTINE W REFLEX MICROSCOPIC
Bilirubin, UA: NEGATIVE
Glucose, UA: NEGATIVE
Ketones, UA: NEGATIVE
Nitrite, UA: NEGATIVE
Protein,UA: NEGATIVE
Specific Gravity, UA: 1.01 (ref 1.005–1.030)
Urobilinogen, Ur: 0.2 mg/dL (ref 0.2–1.0)
pH, UA: 6 (ref 5.0–7.5)

## 2024-01-04 LAB — MICROSCOPIC EXAMINATION: Bacteria, UA: NONE SEEN

## 2024-01-04 NOTE — Patient Instructions (Signed)

## 2024-01-04 NOTE — Progress Notes (Signed)
 01/04/2024 10:59 AM   Pamela Lang 1950-04-05 [id number]  Referring provider: Lennice Mliss [place], PA [address] [place],  [place] [address]  nephrolithiasis   HPI: Pamela Lang is a 73yo here for evaluation of nephrolithiasis. She was drinks 40-48 oz prior to knowing she had a stone. Three weeks ago she developed UTI symptoms and started taking amoxicillin which improved her UTI symptoms. She Then underwent CT 10/17 which showed a 2-13mm left lower pole calculus. She denies any flank pain. NO worsening LUTS.    PMH: 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   MVP (mitral valve prolapse)    no problems   Palpitations    EKG 12/24/18 and ECHO 01/22/19 normal in [facility]  - rx Diltiazem cd 120 mg qd but has not started until after 02/06/19 procedure with  Zamora   PONV (postoperative nausea and vomiting)    SVT (supraventricular tachycardia)    TIA (transient ischemic attack) 2012   TIA    Surgical History: Past Surgical History:  Procedure Laterality Date   25 GAUGE PARS PLANA VITRECTOMY WITH 20 GAUGE MVR PORT FOR MACULAR HOLE Left 02/06/2019   Procedure: 25 GAUGE PARS PLANA VITRECTOMY WITH 20 GAUGE MVR PORT FOR MACULAR HOLE;  Surgeon: Valdemar Rogue, MD;  Location: [facility] OR;  Service: Ophthalmology;  Laterality: Left;   BREAST CYST ASPIRATION Right    CATARACT EXTRACTION Bilateral 2016   Dr. Cleatus   CESAREAN SECTION     x 2   CHOLECYSTECTOMY     COLONOSCOPY     Cyst B/L Wrist Bilateral    GAS/FLUID EXCHANGE Left 02/06/2019   Procedure: Gas/Fluid Exchange;  Surgeon: Valdemar Rogue, MD;  Location: [facility] OR;  Service: Ophthalmology;  Laterality: Left;  C3F8   PHOTOCOAGULATION WITH LASER Left 02/06/2019   Procedure: Photocoagulation With Laser;  Surgeon: Valdemar Rogue, MD;   Location: [facility] OR;  Service: Ophthalmology;  Laterality: Left;   TOOTH EXTRACTION Bilateral    wisdom toothe ext   TUBAL LIGATION      Home Medications:  Allergies as of 01/04/2024       Reactions   Ceclor [cefaclor] Nausea And Vomiting, Other (See Comments)   Severe stomach cramps   Cortisone Other (See Comments)   TIA   Propranolol Other (See Comments)   Decreased heart rate   Aspirin Other (See Comments)   Irritates ulcers   Augmentin [amoxicillin-pot Clavulanate] Nausea And Vomiting   Ciprodex [ciprofloxacin-dexamethasone ]    Migraines   Codeine Nausea And Vomiting   Darvocet [propoxyphene N-acetaminophen ] Nausea And Vomiting   Diazepam    Tachycardia    Doxycycline Nausea And Vomiting   Omnaris [ciclesonide]    Migraines    Other    ALL STEROIDS CAUSE TACHYCARDIA    Sulfa Antibiotics Other (See Comments)   Migraine headache   Sumatriptan Other (See Comments)   Tachycardia    Zofran  [ondansetron  Hcl]    Severe Migraines, Tachycardia    Zoloft [sertraline Hcl]    Tachycardia         Medication List        Accurate as of January 04, 2024 10:59 AM. If you have any questions, ask your nurse or doctor.  ALPRAZolam 0.25 MG tablet Commonly known as: XANAX Take 0.25-0.5 mg by mouth every 6 (six) hours as needed for anxiety.   Bromfenac  Sodium 0.07 % Soln Place 1 drop into the left eye 4 (four) times daily.   Prolensa  0.07 % Soln Generic drug: Bromfenac  Sodium Place 1 drop into the left eye 4 (four) times daily.   famotidine 20 MG tablet Commonly known as: PEPCID Take 20 mg by mouth as needed for heartburn or indigestion.   ketorolac  0.5 % ophthalmic solution Commonly known as: ACULAR  Place 1 drop into the left eye 4 (four) times daily.   lidocaine  5 % Commonly known as: Lidoderm  Place 1 patch onto the skin daily. Remove & Discard patch within 12 hours or as directed by MD        Allergies:  Allergies  Allergen Reactions   Ceclor  [Cefaclor] Nausea And Vomiting and Other (See Comments)    Severe stomach cramps   Cortisone Other (See Comments)    TIA   Propranolol Other (See Comments)    Decreased heart rate   Aspirin Other (See Comments)    Irritates ulcers    Augmentin [Amoxicillin-Pot Clavulanate] Nausea And Vomiting   Ciprodex [Ciprofloxacin-Dexamethasone ]     Migraines   Codeine Nausea And Vomiting   Darvocet [Propoxyphene N-Acetaminophen ] Nausea And Vomiting   Diazepam     Tachycardia    Doxycycline Nausea And Vomiting   Omnaris [Ciclesonide]     Migraines    Other     ALL STEROIDS CAUSE TACHYCARDIA    Sulfa Antibiotics Other (See Comments)    Migraine headache   Sumatriptan Other (See Comments)    Tachycardia    Zofran  [Ondansetron  Hcl]     Severe Migraines, Tachycardia    Zoloft [Sertraline Hcl]     Tachycardia     Family History: Family History  Problem Relation Age of Onset   Transient ischemic attack Mother    COPD Father    Heart disease Father    Diabetes Sister    Diabetes Sister     Social History:  reports that she has quit smoking. Her smoking use included cigarettes. She has quit using smokeless tobacco. She reports that she does not drink alcohol and does not use drugs.  ROS: All other review of systems were reviewed and are negative except what is noted above in HPI  Physical Exam: BP 113/70   Pulse 92   LMP  (LMP Unknown)   Constitutional:  Alert and oriented, No acute distress. HEENT: [place] AT, moist mucus membranes.  Trachea midline, no masses. Cardiovascular: No clubbing, cyanosis, or edema. Respiratory: Normal respiratory effort, no increased work of breathing. GI: Abdomen is soft, nontender, nondistended, no abdominal masses GU: No CVA tenderness.  Lymph: No cervical or inguinal lymphadenopathy. Skin: No rashes, bruises or suspicious lesions. Neurologic: Grossly intact, no focal deficits, moving all 4 extremities. Psychiatric: Normal mood and  affect.  Laboratory Data: Lab Results  Component Value Date   WBC 7.0 02/06/2019   HGB 15.0 02/06/2019   HCT 44.0 02/06/2019   MCV 96.3 02/06/2019   PLT 208 02/06/2019    Lab Results  Component Value Date   CREATININE 0.70 02/06/2019    No results found for: PSA  No results found for: TESTOSTERONE  No results found for: HGBA1C  Urinalysis No results found for: COLORURINE, APPEARANCEUR, LABSPEC, PHURINE, GLUCOSEU, HGBUR, BILIRUBINUR, KETONESUR, PROTEINUR, UROBILINOGEN, NITRITE, LEUKOCYTESUR  No results found for: LABMICR, WBCUA, RBCUA, LABEPIT, MUCUS, BACTERIA  Pertinent  Imaging: CT 12/14/2023: Images reviewed No results found for this or any previous visit.  No results found for this or any previous visit.  No results found for this or any previous visit.  No results found for this or any previous visit.  No results found for this or any previous visit.  No results found for this or any previous visit.  No results found for this or any previous visit.  No results found for this or any previous visit.   Assessment & Plan:    -We discussed the management of kidney stones. These options include observation, ureteroscopy, shockwave lithotripsy (ESWL) and percutaneous nephrolithotomy (PCNL). We discussed which options are relevant to the patient's stone(s). We discussed the natural history of kidney stones as well as the complications of untreated stones and the impact on quality of life without treatment as well as with each of the above listed treatments. We also discussed the efficacy of each treatment in its ability to clear the stone burden. With any of these management options I discussed the signs and symptoms of infection and the need for emergent treatment should these be experienced. For each option we discussed the ability of each procedure to clear the patient of their stone burden.   For observation I described the  risks which include but are not limited to silent renal damage, life-threatening infection, need for emergent surgery, failure to pass stone and pain.   For ureteroscopy I described the risks which include bleeding, infection, damage to contiguous structures, positioning injury, ureteral stricture, ureteral avulsion, ureteral injury, need for prolonged ureteral stent, inability to perform ureteroscopy, need for an interval procedure, inability to clear stone burden, stent discomfort/pain, heart attack, stroke, pulmonary embolus and the inherent risks with general anesthesia.   For shockwave lithotripsy I described the risks which include arrhythmia, kidney contusion, kidney hemorrhage, need for transfusion, pain, inability to adequately break up stone, inability to pass stone fragments, Steinstrasse, infection associated with obstructing stones, need for alternate surgical procedure, need for repeat shockwave lithotripsy, MI, CVA, PE and the inherent risks with anesthesia/conscious sedation.   For PCNL I described the risks including positioning injury, pneumothorax, hydrothorax, need for chest tube, inability to clear stone burden, renal laceration, arterial venous fistula or malformation, need for embolization of kidney, loss of kidney or renal function, need for repeat procedure, need for prolonged nephrostomy tube, ureteral avulsion, MI, CVA, PE and the inherent risks of general anesthesia.   - The patient would like to proceed with observation. Followup 3-4 months with KUB - Urinalysis, Routine w reflex microscopic   No follow-ups on file.  Belvie Clara, MD  [facility] [place]

## 2024-01-08 ENCOUNTER — Telehealth: Payer: Self-pay | Admitting: Urology

## 2024-01-08 NOTE — Telephone Encounter (Signed)
 Return call to patient and made her aware UA are normally review by provider in clinic. Pt is concern with trace of leukocytes and RBC. Pt is aware UA has been routed to MD, McKenzie. Voiced understanding

## 2024-01-08 NOTE — Telephone Encounter (Signed)
 Pt was made aware and voiced understandingThe microscopy showed no RBCs or WBCs.

## 2024-01-08 NOTE — Telephone Encounter (Signed)
 Patient returned call requesting UA results.   Test was completed on 01/04/2024.   Best number to contact patient [phone] Call transferred to clinical basket.

## 2024-01-30 ENCOUNTER — Other Ambulatory Visit: Payer: Self-pay | Admitting: Family Medicine

## 2024-01-30 DIAGNOSIS — Z1231 Encounter for screening mammogram for malignant neoplasm of breast: Secondary | ICD-10-CM

## 2024-01-31 ENCOUNTER — Inpatient Hospital Stay: Admission: RE | Admit: 2024-01-31 | Discharge: 2024-01-31 | Attending: Family Medicine | Admitting: Family Medicine

## 2024-01-31 DIAGNOSIS — Z1231 Encounter for screening mammogram for malignant neoplasm of breast: Secondary | ICD-10-CM

## 2024-03-13 ENCOUNTER — Telehealth: Payer: Self-pay | Admitting: Urology

## 2024-03-13 NOTE — Telephone Encounter (Signed)
 Need KUB order for March appt

## 2024-03-14 ENCOUNTER — Other Ambulatory Visit: Payer: Self-pay

## 2024-03-14 DIAGNOSIS — N200 Calculus of kidney: Secondary | ICD-10-CM

## 2024-03-14 NOTE — Telephone Encounter (Signed)
 Called patient to let them know we put the order in for xray and that he needs to get it about a week before his appointment if possible patient did not answer the phone, per DPR okay to leave voicemail so a  detailed message of the above was left with the advisement to call back if needed

## 2024-05-07 ENCOUNTER — Ambulatory Visit: Admitting: Urology
